# Patient Record
Sex: Female | Born: 1975
Health system: Southern US, Community
[De-identification: ages and names within clinical notes are randomized; demographics above are authoritative.]

## PROBLEM LIST (undated history)

## (undated) DIAGNOSIS — Z8619 Personal history of other infectious and parasitic diseases: Secondary | ICD-10-CM

## (undated) DIAGNOSIS — IMO0002 Reserved for concepts with insufficient information to code with codable children: Secondary | ICD-10-CM

## (undated) DIAGNOSIS — O24419 Gestational diabetes mellitus in pregnancy, unspecified control: Secondary | ICD-10-CM

## (undated) DIAGNOSIS — O09529 Supervision of elderly multigravida, unspecified trimester: Secondary | ICD-10-CM

## (undated) DIAGNOSIS — J302 Other seasonal allergic rhinitis: Secondary | ICD-10-CM

## (undated) DIAGNOSIS — F419 Anxiety disorder, unspecified: Secondary | ICD-10-CM

## (undated) DIAGNOSIS — R87619 Unspecified abnormal cytological findings in specimens from cervix uteri: Secondary | ICD-10-CM

## (undated) HISTORY — PX: BACK SURGERY: SHX140

## (undated) HISTORY — DX: Gestational diabetes mellitus in pregnancy, unspecified control: O24.419

## (undated) HISTORY — DX: Supervision of elderly multigravida, unspecified trimester: O09.529

## (undated) HISTORY — DX: Other seasonal allergic rhinitis: J30.2

## (undated) HISTORY — PX: AUGMENTATION MAMMAPLASTY: SUR837

## (undated) HISTORY — DX: Anxiety disorder, unspecified: F41.9

## (undated) HISTORY — PX: BREAST ENHANCEMENT SURGERY: SHX7

## (undated) HISTORY — DX: Personal history of other infectious and parasitic diseases: Z86.19

## (undated) HISTORY — PX: LEEP: SHX91

## (undated) HISTORY — DX: Reserved for concepts with insufficient information to code with codable children: IMO0002

## (undated) HISTORY — DX: Unspecified abnormal cytological findings in specimens from cervix uteri: R87.619

---

## 2006-04-23 ENCOUNTER — Emergency Department (HOSPITAL_COMMUNITY): Admission: EM | Admit: 2006-04-23 | Discharge: 2006-04-23 | Payer: Self-pay | Admitting: Family Medicine

## 2008-07-22 ENCOUNTER — Other Ambulatory Visit: Admission: RE | Admit: 2008-07-22 | Discharge: 2008-07-22 | Payer: Self-pay | Admitting: Family Medicine

## 2011-03-10 ENCOUNTER — Inpatient Hospital Stay (INDEPENDENT_AMBULATORY_CARE_PROVIDER_SITE_OTHER)
Admission: RE | Admit: 2011-03-10 | Discharge: 2011-03-10 | Disposition: A | Discharge status: Home / Self Care | Payer: 59 | Source: Ambulatory Visit | Attending: Emergency Medicine | Admitting: Emergency Medicine

## 2011-03-10 DIAGNOSIS — B86 Scabies: Secondary | ICD-10-CM

## 2011-04-12 ENCOUNTER — Inpatient Hospital Stay (INDEPENDENT_AMBULATORY_CARE_PROVIDER_SITE_OTHER)
Admission: RE | Admit: 2011-04-12 | Discharge: 2011-04-12 | Disposition: A | Discharge status: Home / Self Care | Payer: 59 | Source: Ambulatory Visit | Attending: Emergency Medicine | Admitting: Emergency Medicine

## 2011-04-12 DIAGNOSIS — L988 Other specified disorders of the skin and subcutaneous tissue: Secondary | ICD-10-CM

## 2011-04-12 DIAGNOSIS — L299 Pruritus, unspecified: Secondary | ICD-10-CM

## 2011-10-24 ENCOUNTER — Ambulatory Visit: Payer: 59 | Admitting: Family Medicine

## 2012-07-04 LAB — OB RESULTS CONSOLE ABO/RH: RH Type: POSITIVE

## 2012-07-04 LAB — OB RESULTS CONSOLE RPR: RPR: NONREACTIVE

## 2012-07-04 LAB — OB RESULTS CONSOLE GC/CHLAMYDIA: Chlamydia: NEGATIVE

## 2012-07-04 LAB — OB RESULTS CONSOLE GBS: GBS: POSITIVE

## 2012-07-04 LAB — OB RESULTS CONSOLE ANTIBODY SCREEN: Antibody Screen: NEGATIVE

## 2012-09-19 NOTE — L&D Delivery Note (Signed)
Delivery Note  First Stage: Labor onset: 0905 - labor induction with cytotec 25 mcg and cervical balloon Augmentation : pitocin low dose after epidural Analgesia /Anesthesia intrapartum: epidural AROM at 1305  Second Stage: Complete dilation at 1750 Onset of pushing at 1800 with 45 minutes rest 1900-1945 (provider attending another birth) FHR second stage cat 1  Delivery of a viable female at 2020 by CNM in ROA position No nuchal cord Cord double clamped after cessation of pulsation, cut by FOB Cord blood sample collected    Third Stage: Placenta delivered [facility] intact with 3 VC @ 2028 Placenta disposition: for disposal Uterine tone firm / bleeding initially brisk - uterine massage until firm  / bladder emptied  Small irregular right labial laceration identified  Anesthesia for repair: epidural Repair 4-0 2 sutures to re-approximate tissue Est. Blood Loss (mL): 400  Complications: none  Mom to postpartum.  Baby to nursery-stable.  Marlinda Mike CNM, MSN 01/22/2013, 9:10 PM

## 2012-10-24 ENCOUNTER — Ambulatory Visit (HOSPITAL_COMMUNITY)
Admission: RE | Admit: 2012-10-24 | Discharge: 2012-10-24 | Disposition: A | Discharge status: Home / Self Care | Payer: 59 | Source: Ambulatory Visit | Attending: Obstetrics & Gynecology | Admitting: Obstetrics & Gynecology

## 2012-10-24 ENCOUNTER — Other Ambulatory Visit (HOSPITAL_COMMUNITY): Payer: Self-pay | Admitting: Obstetrics & Gynecology

## 2012-10-24 DIAGNOSIS — R609 Edema, unspecified: Secondary | ICD-10-CM

## 2012-10-24 DIAGNOSIS — R52 Pain, unspecified: Secondary | ICD-10-CM

## 2012-10-24 DIAGNOSIS — M7989 Other specified soft tissue disorders: Secondary | ICD-10-CM

## 2012-10-24 DIAGNOSIS — M79609 Pain in unspecified limb: Secondary | ICD-10-CM | POA: Insufficient documentation

## 2012-10-24 DIAGNOSIS — O99891 Other specified diseases and conditions complicating pregnancy: Secondary | ICD-10-CM | POA: Insufficient documentation

## 2012-10-24 NOTE — Progress Notes (Signed)
VASCULAR LAB PRELIMINARY  PRELIMINARY  PRELIMINARY  PRELIMINARY  Left lower extremity venous duplex completed.    Preliminary report:  Left:  No evidence of DVT, superficial thrombosis, or Baker's cyst.  Devean Skoczylas, RVT 10/24/2012, 10:26 AM

## 2012-11-24 ENCOUNTER — Encounter: Payer: 59 | Attending: Certified Nurse Midwife | Admitting: *Deleted

## 2012-11-24 ENCOUNTER — Encounter: Payer: Self-pay | Admitting: *Deleted

## 2012-11-24 DIAGNOSIS — Z713 Dietary counseling and surveillance: Secondary | ICD-10-CM | POA: Insufficient documentation

## 2012-11-24 DIAGNOSIS — O99810 Abnormal glucose complicating pregnancy: Secondary | ICD-10-CM | POA: Insufficient documentation

## 2012-11-24 NOTE — Progress Notes (Signed)
  Patient was seen on 11/24/2012 for Gestational Diabetes self-management class at the [facility]. The following learning objectives were met by the patient during this course:   States the definition of Gestational Diabetes  States why dietary management is important in controlling blood glucose  Describes the effects each nutrient has on blood glucose levels  Demonstrates ability to create a balanced meal plan  Demonstrates carbohydrate counting   States when to check blood glucose levels  Demonstrates proper blood glucose monitoring techniques  States the effect of stress and exercise on blood glucose levels  States the importance of limiting caffeine and abstaining from alcohol and smoking  Blood glucose monitor not given: she has already purchased and states she is currently checking her BG  Patient instructed to monitor glucose levels: FBS: 60 - <90 2 hour: <120  *Patient received handouts:  Nutrition Diabetes and Pregnancy  Carbohydrate Counting List  Patient will be seen for follow-up as needed.

## 2012-11-24 NOTE — Patient Instructions (Signed)
Goals:  Check glucose levels per MD as instructed  Follow Gestational Diabetes Diet as instructed  Call for follow-up as needed    

## 2013-01-21 ENCOUNTER — Telehealth (HOSPITAL_COMMUNITY): Payer: Self-pay | Admitting: *Deleted

## 2013-01-21 ENCOUNTER — Encounter (HOSPITAL_COMMUNITY): Payer: Self-pay | Admitting: *Deleted

## 2013-01-21 NOTE — Telephone Encounter (Signed)
Preadmission screen  

## 2013-01-22 ENCOUNTER — Encounter (HOSPITAL_COMMUNITY): Payer: Self-pay

## 2013-01-22 ENCOUNTER — Encounter (HOSPITAL_COMMUNITY): Payer: Self-pay | Admitting: Anesthesiology

## 2013-01-22 ENCOUNTER — Inpatient Hospital Stay (HOSPITAL_COMMUNITY): Payer: 59 | Admitting: Anesthesiology

## 2013-01-22 ENCOUNTER — Inpatient Hospital Stay (HOSPITAL_COMMUNITY)
Admission: RE | Admit: 2013-01-22 | Discharge: 2013-01-24 | DRG: 775 | Disposition: A | Discharge status: Home / Self Care | Payer: 59 | Source: Ambulatory Visit | Attending: Obstetrics and Gynecology | Admitting: Obstetrics and Gynecology

## 2013-01-22 DIAGNOSIS — D62 Acute posthemorrhagic anemia: Secondary | ICD-10-CM | POA: Diagnosis not present

## 2013-01-22 DIAGNOSIS — O9903 Anemia complicating the puerperium: Secondary | ICD-10-CM | POA: Diagnosis not present

## 2013-01-22 DIAGNOSIS — G5600 Carpal tunnel syndrome, unspecified upper limb: Secondary | ICD-10-CM | POA: Diagnosis present

## 2013-01-22 DIAGNOSIS — O09529 Supervision of elderly multigravida, unspecified trimester: Secondary | ICD-10-CM | POA: Diagnosis present

## 2013-01-22 DIAGNOSIS — Z22330 Carrier of Group B streptococcus: Secondary | ICD-10-CM

## 2013-01-22 DIAGNOSIS — O99814 Abnormal glucose complicating childbirth: Principal | ICD-10-CM | POA: Diagnosis present

## 2013-01-22 DIAGNOSIS — O99892 Other specified diseases and conditions complicating childbirth: Secondary | ICD-10-CM | POA: Diagnosis present

## 2013-01-22 DIAGNOSIS — O24410 Gestational diabetes mellitus in pregnancy, diet controlled: Secondary | ICD-10-CM | POA: Diagnosis present

## 2013-01-22 LAB — CBC
HCT: 33.2 % — ABNORMAL LOW (ref 36.0–46.0)
Hemoglobin: 10.8 g/dL — ABNORMAL LOW (ref 12.0–15.0)
MCH: 29.3 pg (ref 26.0–34.0)
MCHC: 32.5 g/dL (ref 30.0–36.0)
MCV: 90.2 fL (ref 78.0–100.0)
Platelets: 180 10*3/uL (ref 150–400)
RBC: 3.68 MIL/uL — ABNORMAL LOW (ref 3.87–5.11)
RDW: 14 % (ref 11.5–15.5)
WBC: 9.4 10*3/uL (ref 4.0–10.5)

## 2013-01-22 LAB — GLUCOSE, RANDOM: Glucose, Bld: 100 mg/dL — ABNORMAL HIGH (ref 70–99)

## 2013-01-22 LAB — RPR: RPR Ser Ql: NONREACTIVE

## 2013-01-22 MED ORDER — SODIUM CHLORIDE 0.9 % IV SOLN: 250.0000 mL | INTRAVENOUS | Status: DC | PRN

## 2013-01-22 MED ORDER — OXYTOCIN BOLUS FROM INFUSION
500.0000 mL | INTRAVENOUS | Status: DC
  Administered 2013-01-22: 500 mL via INTRAVENOUS

## 2013-01-22 MED ORDER — MISOPROSTOL 25 MCG QUARTER TABLET
25.0000 ug | ORAL_TABLET | Freq: Once | ORAL | Status: AC
  Administered 2013-01-22: 25 ug via VAGINAL
  Filled 2013-01-22: qty 0.25

## 2013-01-22 MED ORDER — LACTATED RINGERS IV SOLN
500.0000 mL | INTRAVENOUS | Status: DC | PRN
  Administered 2013-01-22: 500 mL via INTRAVENOUS

## 2013-01-22 MED ORDER — CITRIC ACID-SODIUM CITRATE 334-500 MG/5ML PO SOLN: 30.0000 mL | ORAL | Status: DC | PRN

## 2013-01-22 MED ORDER — FENTANYL 2.5 MCG/ML BUPIVACAINE 1/10 % EPIDURAL INFUSION (WH - ANES)
INTRAMUSCULAR | Status: DC | PRN
  Administered 2013-01-22: 14 mL/h via EPIDURAL

## 2013-01-22 MED ORDER — EPHEDRINE 5 MG/ML INJ
10.0000 mg | INTRAVENOUS | Status: DC | PRN
  Filled 2013-01-22: qty 2

## 2013-01-22 MED ORDER — LIDOCAINE HCL (PF) 1 % IJ SOLN
30.0000 mL | INTRAMUSCULAR | Status: DC | PRN
  Filled 2013-01-22: qty 30

## 2013-01-22 MED ORDER — OXYTOCIN 40 UNITS IN LACTATED RINGERS INFUSION - SIMPLE MED
1.0000 m[IU]/min | INTRAVENOUS | Status: DC
  Administered 2013-01-22: 8 m[IU]/min via INTRAVENOUS
  Administered 2013-01-22: 6 m[IU]/min via INTRAVENOUS
  Administered 2013-01-22: 4 m[IU]/min via INTRAVENOUS
  Administered 2013-01-22: 10 m[IU]/min via INTRAVENOUS
  Administered 2013-01-22: 2 m[IU]/min via INTRAVENOUS
  Filled 2013-01-22: qty 1000

## 2013-01-22 MED ORDER — SODIUM CHLORIDE 0.9 % IJ SOLN: 3.0000 mL | INTRAMUSCULAR | Status: DC | PRN

## 2013-01-22 MED ORDER — ACETAMINOPHEN 325 MG PO TABS: 650.0000 mg | ORAL_TABLET | ORAL | Status: DC | PRN

## 2013-01-22 MED ORDER — FENTANYL 2.5 MCG/ML BUPIVACAINE 1/10 % EPIDURAL INFUSION (WH - ANES)
14.0000 mL/h | INTRAMUSCULAR | Status: DC | PRN
  Filled 2013-01-22: qty 125

## 2013-01-22 MED ORDER — LACTATED RINGERS IV SOLN: 500.0000 mL | Freq: Once | INTRAVENOUS | Status: DC

## 2013-01-22 MED ORDER — EPHEDRINE 5 MG/ML INJ
10.0000 mg | INTRAVENOUS | Status: DC | PRN
  Filled 2013-01-22: qty 4
  Filled 2013-01-22: qty 2
  Filled 2013-01-22: qty 4

## 2013-01-22 MED ORDER — OXYTOCIN 40 UNITS IN LACTATED RINGERS INFUSION - SIMPLE MED: 62.5000 mL/h | INTRAVENOUS | Status: DC

## 2013-01-22 MED ORDER — PHENYLEPHRINE 40 MCG/ML (10ML) SYRINGE FOR IV PUSH (FOR BLOOD PRESSURE SUPPORT)
80.0000 ug | PREFILLED_SYRINGE | INTRAVENOUS | Status: DC | PRN
  Filled 2013-01-22: qty 2

## 2013-01-22 MED ORDER — SODIUM CHLORIDE 0.9 % IJ SOLN: 3.0000 mL | Freq: Two times a day (BID) | INTRAMUSCULAR | Status: DC

## 2013-01-22 MED ORDER — VANCOMYCIN HCL IN DEXTROSE 1-5 GM/200ML-% IV SOLN
1000.0000 mg | Freq: Two times a day (BID) | INTRAVENOUS | Status: DC
  Administered 2013-01-22: 1000 mg via INTRAVENOUS
  Filled 2013-01-22 (×3): qty 200

## 2013-01-22 MED ORDER — IBUPROFEN 600 MG PO TABS
600.0000 mg | ORAL_TABLET | Freq: Four times a day (QID) | ORAL | Status: DC | PRN
  Administered 2013-01-22: 600 mg via ORAL
  Filled 2013-01-22: qty 1

## 2013-01-22 MED ORDER — LIDOCAINE HCL (PF) 1 % IJ SOLN
INTRAMUSCULAR | Status: DC | PRN
  Administered 2013-01-22 (×2): 8 mL

## 2013-01-22 MED ORDER — DIPHENHYDRAMINE HCL 50 MG/ML IJ SOLN: 12.5000 mg | INTRAMUSCULAR | Status: DC | PRN

## 2013-01-22 MED ORDER — PHENYLEPHRINE 40 MCG/ML (10ML) SYRINGE FOR IV PUSH (FOR BLOOD PRESSURE SUPPORT)
80.0000 ug | PREFILLED_SYRINGE | INTRAVENOUS | Status: DC | PRN
  Filled 2013-01-22: qty 5
  Filled 2013-01-22: qty 2

## 2013-01-22 NOTE — Progress Notes (Signed)
PROCEDURE NOTE  silicone cervical balloon placed bimanually without difficulty inflated with 60 ml patient tolerated procedure well - no pain or bleeding Secured to leg with mild traction  Cytotec 25 mcg placed posterior fornix  PLAN: apply traction to balloon  re-evaluate after balloon out   Marlinda Mike CNM

## 2013-01-22 NOTE — H&P (Signed)
  OB ADMISSION/ HISTORY & PHYSICAL:  Admission Date: 01/22/2013  7:17 AM  Admit Diagnosis: induction of labor for GDMa1  Vanessa [name] is a 37 y.o. female presenting for labor induction.  Prenatal History: G1P0000   EDC : 01/29/2013, Alternate EDD Entry  Prenatal care at [facility]  Primary Ob Provider: Kathi Ludwig Prenatal course complicated by [facility]  / GDMa1 / resistant GBS bacturia  Prenatal Labs: ABO, Rh: A (10/08 0000) positive Antibody: Negative (10/16 0000) Rubella: Immune (10/16 0000)  RPR: Nonreactive (10/16 0000)  HBsAg: Negative (10/16 0000)  HIV: Non-reactive (10/16 0000)  GBS: Positive (10/16 0000)  1 hr Glucola : ABNORMAL  Medical / Surgical History :  Past medical history:  Past Medical History  Diagnosis Date  . Diabetes mellitus without complication   . Gestational diabetes   . Abnormal Pap smear   . AMA (advanced maternal age) multigravida 35+   . Hx of varicella     Past surgical history:  Past Surgical History  Procedure Laterality Date  . Leep    . Breast enhancement surgery     Family History:  Family History  Problem Relation Age of Onset  . Rheum arthritis Mother   . Anemia Mother   . Migraines Mother   . Diabetes Father   . Hypertension Father   . Diabetes Maternal Grandmother   . Stroke Maternal Grandfather   . Heart disease Maternal Grandfather   . COPD Paternal Grandfather      Social History:  reports that she quit smoking about 16 years ago. She has never used smokeless tobacco. She reports that she does not drink alcohol or use illicit drugs.  Allergies: Penicillins and Sulfa antibiotics   Current Medications at time of admission:  Prenatal daily  Review of Systems: irregular ctx no LOF active FM  Physical Exam:  VS: Blood pressure 117/73, pulse 77, temperature 98.6 F (37 C), temperature source Oral, resp. rate 18, height 5\' 6"  (1.676 m), weight 73.936 kg (163 lb).  General: alert and oriented, appears  comfortable  Heart: RRR Lungs: Clear lung fields Abdomen: Gravid, soft and non-tender, non-distended / uterus: gravid and non-tender Extremities: trace edema - wearing compression socks  Genitalia / VE: 2-3 / 70% / -1 FHR: category 1 TOCO: rare ctx  Assessment: [redacted] weeks gestation GDMA1 FHR category 1  Plan:  cytotec and cervical balloon - then pitocin / AROM Dr [place] notified of admission / plan of care   Marlinda Mike CNM, MSN 01/22/2013, 0800

## 2013-01-22 NOTE — Progress Notes (Signed)
S:  Comfortable with epidural  O:  VS: Blood pressure 112/75, pulse 95, temperature 98.1 F (36.7 C), temperature source Oral, resp. rate 20, height 5\' 6"  (1.676 m), weight 73.936 kg (163 lb), SpO2 100.00%.        FHR : baseline 145 / variability moderate / accels + / decels none        Toco: contractions every 2-3 minutes / strong  / MVU adequate        Cervix : 10/100%/ Vtx +1        Membranes: clear fluid / + show  A: active labor     FHR category 1  P: start active second stage     Marlinda Mike CNM, MSN 01/22/2013, 1800pm

## 2013-01-22 NOTE — Progress Notes (Signed)
S:  Balloon out  O:  VS: Blood pressure 112/69, pulse 72, temperature 98.1 F (36.7 C), temperature source Oral, resp. rate 18, height 5\' 6"  (1.676 m), weight 73.936 kg (163 lb).        FHR : category 1        Toco: contractions every 1-3 minutes / 40-50 sec / mild       Cervix : 6/100% / vtx +1 BBOW / + show        Membranes: AROM - clear  A: latent labor - successful labor induction     FHR category 1  P: IUPC to assess MVU - start pitocin     Marlinda Mike CNM, MSN 01/22/2013, 1:01 PM

## 2013-01-22 NOTE — Progress Notes (Signed)
S:  Comfortable with epidural - no pain or pressure  O:  VS: Blood pressure 128/71, pulse 85, temperature 98.6 F (37 C), temperature source Oral, resp. rate 18, height 5\' 6"  (1.676 m), weight 73.936 kg (163 lb), SpO2 100.00%.        FHR : baseline 130 / variability moderate / accels + / decels none        Toco: contractions every 2-3 minutes / mild / MVU 70-110 ( No pitocin started yet)        Cervix : 6 / 90% / vtx / 0        Membranes: clear fluid  A: latent labor     FHR category 1  P: pitocin augmentation      Foley      Reposition lateral   Marlinda Mike CNM, MSN 01/22/2013, 3:21 PM

## 2013-01-22 NOTE — Anesthesia Procedure Notes (Signed)
Epidural Patient location during procedure: OB Start time: 01/22/2013 2:17 PM End time: 01/22/2013 2:25 PM  Staffing Anesthesiologist: Sandrea Hughs Performed by: anesthesiologist   Preanesthetic Checklist Completed: patient identified, site marked, surgical consent, pre-op evaluation, timeout performed, IV checked, risks and benefits discussed and monitors and equipment checked  Epidural Patient position: sitting Prep: site prepped and draped and DuraPrep Patient monitoring: continuous pulse ox and blood pressure Approach: midline Injection technique: LOR air  Needle:  Needle type: Tuohy  Needle gauge: 17 G Needle length: 9 cm and 9 Needle insertion depth: 5 cm cm Catheter type: closed end flexible Catheter size: 19 Gauge Catheter at skin depth: 10 cm Test dose: negative and Other  Assessment Sensory level: T9 Events: blood not aspirated, injection not painful, no injection resistance, negative IV test and no paresthesia  Additional Notes Reason for block:procedure for pain

## 2013-01-22 NOTE — Anesthesia Preprocedure Evaluation (Signed)
Anesthesia Evaluation  Patient identified by MRN, date of birth, ID band Patient awake    Reviewed: Allergy & Precautions, H&P , NPO status , Patient's Chart, lab work & pertinent test results  Airway Mallampati: I TM Distance: >3 FB Neck ROM: full    Dental no notable dental hx.    Pulmonary neg pulmonary ROS,    Pulmonary exam normal       Cardiovascular negative cardio ROS      Neuro/Psych negative neurological ROS  negative psych ROS   GI/Hepatic negative GI ROS, Neg liver ROS,   Endo/Other    Renal/GU negative Renal ROS  negative genitourinary   Musculoskeletal negative musculoskeletal ROS (+)   Abdominal Normal abdominal exam  (+)   Peds negative pediatric ROS (+)  Hematology negative hematology ROS (+)   Anesthesia Other Findings   Reproductive/Obstetrics (+) Pregnancy                           Anesthesia Physical Anesthesia Plan  ASA: II  Anesthesia Plan: Epidural   Post-op Pain Management:    Induction:   Airway Management Planned:   Additional Equipment:   Intra-op Plan:   Post-operative Plan:   Informed Consent: I have reviewed the patients History and Physical, chart, labs and discussed the procedure including the risks, benefits and alternatives for the proposed anesthesia with the patient or authorized representative who has indicated his/her understanding and acceptance.     Plan Discussed with:   Anesthesia Plan Comments:         Anesthesia Quick Evaluation

## 2013-01-23 LAB — CBC
HCT: 28.2 % — ABNORMAL LOW (ref 36.0–46.0)
Hemoglobin: 9.4 g/dL — ABNORMAL LOW (ref 12.0–15.0)
MCH: 29.7 pg (ref 26.0–34.0)
MCHC: 33.3 g/dL (ref 30.0–36.0)
MCV: 89.2 fL (ref 78.0–100.0)
Platelets: 172 10*3/uL (ref 150–400)
RBC: 3.16 MIL/uL — ABNORMAL LOW (ref 3.87–5.11)
RDW: 13.9 % (ref 11.5–15.5)
WBC: 15.9 10*3/uL — ABNORMAL HIGH (ref 4.0–10.5)

## 2013-01-23 MED ORDER — HYDROCORTISONE 2.5 % RE CREA
TOPICAL_CREAM | Freq: Three times a day (TID) | RECTAL | Status: DC
  Administered 2013-01-23 – 2013-01-24 (×3): via RECTAL
  Filled 2013-01-23: qty 28.35

## 2013-01-23 MED ORDER — OXYCODONE-ACETAMINOPHEN 5-325 MG PO TABS
1.0000 | ORAL_TABLET | ORAL | Status: DC | PRN
  Administered 2013-01-23 (×3): 1 via ORAL
  Administered 2013-01-24 (×2): 2 via ORAL
  Filled 2013-01-23 (×3): qty 1
  Filled 2013-01-23 (×2): qty 2

## 2013-01-23 MED ORDER — SENNOSIDES-DOCUSATE SODIUM 8.6-50 MG PO TABS
2.0000 | ORAL_TABLET | Freq: Every day | ORAL | Status: DC
  Administered 2013-01-23: 2 via ORAL

## 2013-01-23 MED ORDER — IBUPROFEN 600 MG PO TABS
600.0000 mg | ORAL_TABLET | Freq: Four times a day (QID) | ORAL | Status: DC
  Administered 2013-01-23 – 2013-01-24 (×6): 600 mg via ORAL
  Filled 2013-01-23 (×6): qty 1

## 2013-01-23 MED ORDER — DIPHENHYDRAMINE HCL 25 MG PO CAPS: 25.0000 mg | ORAL_CAPSULE | Freq: Four times a day (QID) | ORAL | Status: DC | PRN

## 2013-01-23 MED ORDER — BENZOCAINE-MENTHOL 20-0.5 % EX AERO
1.0000 "application " | INHALATION_SPRAY | CUTANEOUS | Status: DC | PRN
  Filled 2013-01-23: qty 56

## 2013-01-23 MED ORDER — SIMETHICONE 80 MG PO CHEW: 80.0000 mg | CHEWABLE_TABLET | ORAL | Status: DC | PRN

## 2013-01-23 MED ORDER — WITCH HAZEL-GLYCERIN EX PADS
1.0000 "application " | MEDICATED_PAD | CUTANEOUS | Status: DC | PRN
  Administered 2013-01-23: 1 via TOPICAL

## 2013-01-23 MED ORDER — DIBUCAINE 1 % RE OINT: 1.0000 "application " | TOPICAL_OINTMENT | RECTAL | Status: DC | PRN

## 2013-01-23 MED ORDER — LANOLIN HYDROUS EX OINT: TOPICAL_OINTMENT | CUTANEOUS | Status: DC | PRN

## 2013-01-23 NOTE — Lactation Note (Signed)
This note was copied from the chart of Vanessa [name]. Lactation Consultation Note  Patient Name: Vanessa [name] Date: 01/23/2013 Reason for consult: Initial assessment   Maternal Data Formula Feeding for Exclusion: No Infant to breast within first hour of birth: Yes Does the patient have breastfeeding experience prior to this delivery?: No  Feeding Feeding Type: Breast Milk Feeding method: Breast Length of feed: 25 min  LATCH Score/Interventions Latch: Grasps breast easily, tongue down, lips flanged, rhythmical sucking.  Audible Swallowing: A few with stimulation  Type of Nipple: Everted at rest and after stimulation  Comfort (Breast/Nipple): Soft / non-tender     Hold (Positioning): Assistance needed to correctly position infant at breast and maintain latch. Intervention(s): Breastfeeding basics reviewed;Support Pillows;Position options  LATCH Score: 8  Lactation Tools Discussed/Used     Consult Status Consult Status: Follow-up Date: 01/24/13 Follow-up type: In-patient  Initial visit with mom. She reports that baby nursed well after delivery but not as well through the night. Reports nipples are slightly tender today. Assisted with latch- mom reports that feels much better. Reviewed wide open mouth and keeping the baby deep onto the breast with good support of the breast throughout the feeding. Reviewed feeding cues and encouraged to feed whenever she sees them. BF brochure given with resources for support after DC. Encouraged to page for assist prn. No questions at present.   Pamelia Hoit 01/23/2013, 10:44 AM

## 2013-01-23 NOTE — Progress Notes (Signed)
Patient ID: Vanessa [name], female   DOB: [birth year], 37 y.o.   MRN: [id number] PPD # 1 S/P SVD  Subjective: Pt reports feeling well, sore, tired/ Pain controlled with ibuprofen Tolerating po/ Voiding without problems/ No n/v Bleeding is light Newborn info:  F Feeding: breast   Objective:  VS: Blood pressure 105/68, pulse 77, temperature 97.6 F (36.4 C), temperature source Oral, resp. rate 18.    Recent Labs  01/22/13 0830 01/23/13 0700  WBC 9.4 15.9*  HGB 10.8* 9.4*  HCT 33.2* 28.2*  PLT 180 172    Blood type: --/Positive/-- (10/16 0000) Rubella: Immune (10/16 0000)    Physical Exam:  General: A & O x 3  alert, cooperative and no distress CV: Regular rate and rhythm Resp: clear Abdomen: soft, nontender, normal bowel sounds Uterine Fundus: firm, below umbilicus, nontender Perineum: Intact with mod edema; mild ecchymosis noted Lochia: moderate Ext: Homans sign is negative, no sign of DVT and no edema, redness or tenderness in the calves or thighs   A/P: PPD # 1/ G2P1001/ S/P: SVD Mild ABL anemia; fe supplement at d/c to home Doing well Continue routine post partum orders Anticipate D/C home tomorrow    Demetrius Revel, MSN, [facility] 01/23/2013, 9:38 AM

## 2013-01-23 NOTE — Anesthesia Postprocedure Evaluation (Signed)
  Anesthesia Post-op Note  Patient: Vanessa [name]  Procedure(s) Performed: * No procedures listed *  Patient Location: PACU and Mother/Baby  Anesthesia Type:Epidural  Level of Consciousness: awake, alert  and oriented  Airway and Oxygen Therapy: Patient Spontanous Breathing  Post-op Pain: mild  Post-op Assessment: Patient's Cardiovascular Status Stable, Respiratory Function Stable, No signs of Nausea or vomiting, Adequate PO intake, Pain level controlled, No headache, No backache, No residual numbness and No residual motor weakness  Post-op Vital Signs: stable  Complications: No apparent anesthesia complications

## 2013-01-24 ENCOUNTER — Encounter (HOSPITAL_COMMUNITY): Payer: Self-pay

## 2013-01-24 MED ORDER — FERRALET 90 90-1 MG PO TABS: 1.0000 | ORAL_TABLET | Freq: Every day | ORAL | Status: DC

## 2013-01-24 MED ORDER — OXYCODONE-ACETAMINOPHEN 5-325 MG PO TABS: 1.0000 | ORAL_TABLET | Freq: Four times a day (QID) | ORAL | Status: DC | PRN

## 2013-01-24 MED ORDER — HYDROCHLOROTHIAZIDE 12.5 MG PO CAPS: 12.5000 mg | ORAL_CAPSULE | Freq: Every day | ORAL | Status: DC

## 2013-01-24 MED ORDER — IBUPROFEN 600 MG PO TABS: 600.0000 mg | ORAL_TABLET | Freq: Four times a day (QID) | ORAL | Status: DC | PRN

## 2013-01-24 NOTE — Discharge Summary (Signed)
Obstetric Discharge Summary  Reason for Admission: G1 P0 IOL @39wks  d/t GDM A1 Prenatal Procedures: ultrasound Intrapartum Procedures: spontaneous vaginal delivery Postpartum Procedures: none Complications-Operative and Postpartum: none Hemoglobin  Date Value Range Status  01/23/2013 9.4* 12.0 - 15.0 g/dL Final     HCT  Date Value Range Status  01/23/2013 28.2* 36.0 - 46.0 % Final    Physical Exam:  General: alert, cooperative and no distress Lochia: appropriate Uterine Fundus: firm Incision: na DVT Evaluation: No evidence of DVT seen on physical exam. Negative Homan's sign.  Discharge Diagnoses: G1 P1 s/p SVD GDM A1; resolved ABL Anemia; stable Carpal Tunnel syndrome; will manage with pain meds  Discharge Information: Date: 01/24/2013 Activity: pelvic rest Diet: routine Medications: PNV, Ibuprofen, Colace and Percocet Condition: stable Instructions: refer to practice specific booklet Discharge to: home Follow-up Information   Follow up with Marlinda Mike, CNM In 6 weeks.   Contact information:   Vanessa [name] [place] [address] [phone]       Newborn Data: Live born female on 01/[birth year]/13 Birth Weight: 8 lb 4 oz (3742 g) APGAR: 9, 9  Home with mother.  Vanessa [name] 01/24/2013, 9:30 AM

## 2013-01-24 NOTE — Progress Notes (Signed)
Patient ID: Vanessa Vanessa [name] Vanessa [name], female   DOB: 03/14/1976, 37 y.o.   MRN: [id number] PPD # 2  Subjective: Pt reports feeling well and eager for d/c home/ Pain controlled with ibuprofen.  Has taken percocet for carpal tunnel pain and note relief Tolerating po/ Voiding without problems/ No n/v Bleeding is light/ Newborn info:  Information for the patient's newborn:  Vanessa Vanessa [name], Vanessa [name] [[id number]]  female Feeding: breast    Objective:  VS: Blood pressure 94/57, pulse 76, temperature 97.8 F (36.6 C), temperature source Oral, resp. rate 18.    Recent Labs  01/22/13 0830 01/23/13 0700  WBC 9.4 15.9*  HGB 10.8* 9.4*  HCT 33.2* 28.2*  PLT 180 172    Blood type:  A Positive/-- (10/16 0000) Rubella: Immune (10/16 0000)    Physical Exam:  General:  alert, cooperative and no distress CV: Regular rate and rhythm Resp: clear Abdomen: soft, nontender, normal bowel sounds Uterine Fundus: firm, below umbilicus, nontender Perineum: Intact with mild edema Lochia: minimal Ext: edema trace and Homans sign is negative, no sign of DVT    A/P: PPD # 2/ G1 P1001/ S/P: SVD ABL Anemia Carpal Tunnel pain; urged increased po fluids, ok for splints, but reassured pain should resolve in 6 to 7 days Will rx HCTZ for fluid retention to help relieve carpal tunnel pain Doing well and stable for discharge home RX: Ibuprofen 600mg  po Q 6 hrs prn pain #30 Refill x 1 Percocet 5/325 1 to 2 po Q 4 hrs prn pain #15 No refill Ferralet 90 po QD #30 Ref x 1 HCTZ 12.5 po QD x 5 WOB/GYN booklet given Routine pp visit in 6wks   Demetrius Revel, MSN, [facility] 01/24/2013, 8:49 AM

## 2013-01-28 ENCOUNTER — Ambulatory Visit (HOSPITAL_COMMUNITY)
Admission: RE | Admit: 2013-01-28 | Discharge: 2013-01-28 | Disposition: A | Discharge status: Home / Self Care | Payer: 59 | Source: Ambulatory Visit | Attending: Obstetrics and Gynecology | Admitting: Obstetrics and Gynecology

## 2013-01-28 ENCOUNTER — Telehealth (HOSPITAL_COMMUNITY): Payer: Self-pay | Admitting: Lactation Services

## 2013-01-28 NOTE — Lactation Note (Signed)
Adult Lactation Consultation Outpatient Visit Note  Patient Name: Vanessa [name]                 Baby Girl Zanna, DOB [birth year], now 38 days old, Birth Weight 8 lb. 4 oz.                                           Date of Birth: Aug 16, 1976 Gestational Age at Delivery: [redacted]w[redacted]d Type of Delivery: SVB  Breastfeeding History: Frequency of Breastfeeding: every 2-3 hours  Length of Feeding: 15-30 minutes on right breast, left breast sore, cracked/scabbed Voids: 5-6 per day Stools: 2-3/day mustard color  Supplementing / Method: Pumping:  Type of Pump:  Was using a Harmony Hand pump, Purchased a DEBP today - Medela   Frequency: Pumped 1 time with hand pump on Saturday, and 1 time today  Volume:  1/2 oz on Saturday, 1 1/2 oz today  Comments: Mom is here for feeding assessment. She reports her left nipple is cracked/scabbed and she has had to pump this breast twice as it was too sore to latch. She has tenderness on the right breast but has been breastfeeding on the right consistently. Went to Peds last Saturday and was told to supplement with formula due to baby's weight loss. Mom supplemented 2 times with formula, otherwise she has been using EBM pumped from the left breast. Mom also has difficulty with positioning due to carpel tunnel in both wrists/hands which causes numbness when holding the baby at the breast. Spoke with Mom earlier today and advised to call OB for All Purpose Nipple Cream for sore nipples and she reports they are calling this in for her. Mom has history of breast implants.    Consultation Evaluation: On exam, right nipple is red with excoriation, the left nipple is scabbed. Worked with Mom on positioning that would relieve her carpel tunnel. This baby has a strong suck and as soon as Mom's gets her nipple close the baby wants to nibble on the breast. Assisted Mom to latch baby, obtaining a deep latch with the initial latch. Mom reported no pain with nursing. When the baby came off the breast,  there was not compression, the nipple was round on both sides. No bleeding observed from either breast.   Initial Feeding Assessment: Pre-feed Weight:  7 lb. 13.8 oz/3566 gm Post-feed Weight:   7 lb. 14.8 oz/3596 gm Amount Transferred:  30 ml Comments:  From right breast with nursing for 12 minutes  Additional Feeding Assessment: Pre-feed Weight:   7 lb. 14.8 oz/3596 gm Post-feed Weight:  7 lb. 15.7 oz/3620 gm Amount Transferred:  24 ml Comments:  From Left breast with nursing for 15 minutes  Additional Feeding Assessment: Pre-feed Weight:   7 lb. 15.7 oz/3620 gm Post-feed Weight:  8 lb. 0.1 oz/3630 gm Amount Transferred:  10 ml Comments:  With Mom re-latching to left breast without assist  Total Breast milk Transferred this Visit: 64 ml Total Supplement Given:   None  Additional Interventions: Mom was relieved that baby was getting a good feeding and she did not have pain with breast feeding. Reviewed ways to obtain a deep latch with initial latch. Reviewed positioning. Care for sore nipples reviewed. Comfort gels given with instructions to alternate with All Purpose Nipple Cream. Advised to limit pumping as we do not want to encourage over production with implants. If the  baby does not BF, she needs to pump, but if the baby is breast feeding 8-12 times in 24 hours she should not need to pump based on this feeding today.   Follow-Up Prn Support group.     Alfred Levins 01/28/2013, 4:18 PM

## 2013-01-29 ENCOUNTER — Inpatient Hospital Stay (HOSPITAL_COMMUNITY): Admission: AD | Admit: 2013-01-29 | Payer: Self-pay | Source: Ambulatory Visit | Admitting: Obstetrics & Gynecology

## 2013-02-01 ENCOUNTER — Ambulatory Visit (HOSPITAL_COMMUNITY): Payer: 59

## 2014-05-28 ENCOUNTER — Ambulatory Visit: Payer: 59 | Admitting: Internal Medicine

## 2014-07-21 ENCOUNTER — Encounter (HOSPITAL_COMMUNITY): Payer: Self-pay

## 2015-03-05 ENCOUNTER — Ambulatory Visit
Admission: RE | Admit: 2015-03-05 | Discharge: 2015-03-05 | Disposition: A | Discharge status: Home / Self Care | Payer: 59 | Source: Ambulatory Visit | Attending: Otolaryngology | Admitting: Otolaryngology

## 2015-03-05 ENCOUNTER — Other Ambulatory Visit: Payer: Self-pay | Admitting: Otolaryngology

## 2015-03-05 DIAGNOSIS — R0981 Nasal congestion: Secondary | ICD-10-CM

## 2015-09-22 MED FILL — FLUoxetine HCL 10 MG CAPS: 10 | 90 days supply | Qty: 90 | Fill #0

## 2015-09-22 MED FILL — INTROVALE 0.15-0.03 MG TAB: 0.15-0.03 | 90 days supply | Qty: 91 | Fill #0

## 2015-10-28 MED FILL — NABUMETONE 500 MG TABLET: 500 | 30 days supply | Qty: 60 | Fill #1

## 2015-12-09 DIAGNOSIS — R7303 Prediabetes: Secondary | ICD-10-CM | POA: Diagnosis not present

## 2015-12-09 DIAGNOSIS — Z13220 Encounter for screening for lipoid disorders: Secondary | ICD-10-CM | POA: Diagnosis not present

## 2015-12-09 DIAGNOSIS — Z0000 Encounter for general adult medical examination without abnormal findings: Secondary | ICD-10-CM | POA: Diagnosis not present

## 2015-12-09 DIAGNOSIS — E559 Vitamin D deficiency, unspecified: Secondary | ICD-10-CM | POA: Diagnosis not present

## 2015-12-09 DIAGNOSIS — R5382 Chronic fatigue, unspecified: Secondary | ICD-10-CM | POA: Diagnosis not present

## 2015-12-10 ENCOUNTER — Other Ambulatory Visit: Payer: Self-pay | Admitting: Family Medicine

## 2015-12-10 DIAGNOSIS — K469 Unspecified abdominal hernia without obstruction or gangrene: Secondary | ICD-10-CM

## 2015-12-11 ENCOUNTER — Ambulatory Visit (HOSPITAL_COMMUNITY): Payer: 59

## 2015-12-14 MED FILL — INTROVALE 0.15-0.03 MG TAB: 0.15-0.03 | 90 days supply | Qty: 91 | Fill #1

## 2015-12-16 MED FILL — FLUoxetine HCL 10 MG CAPS: 10 | 90 days supply | Qty: 90 | Fill #1

## 2015-12-17 ENCOUNTER — Ambulatory Visit
Admission: RE | Admit: 2015-12-17 | Discharge: 2015-12-17 | Disposition: A | Discharge status: Home / Self Care | Payer: 59 | Source: Ambulatory Visit | Attending: Family Medicine | Admitting: Family Medicine

## 2015-12-17 DIAGNOSIS — K469 Unspecified abdominal hernia without obstruction or gangrene: Secondary | ICD-10-CM

## 2015-12-17 DIAGNOSIS — K439 Ventral hernia without obstruction or gangrene: Secondary | ICD-10-CM | POA: Diagnosis not present

## 2016-01-13 ENCOUNTER — Ambulatory Visit: Payer: Self-pay | Admitting: Surgery

## 2016-01-13 DIAGNOSIS — K429 Umbilical hernia without obstruction or gangrene: Secondary | ICD-10-CM | POA: Diagnosis not present

## 2016-01-19 ENCOUNTER — Telehealth: Payer: 59 | Admitting: Family

## 2016-01-19 DIAGNOSIS — R0602 Shortness of breath: Secondary | ICD-10-CM

## 2016-01-19 DIAGNOSIS — R05 Cough: Secondary | ICD-10-CM | POA: Diagnosis not present

## 2016-01-19 DIAGNOSIS — R059 Cough, unspecified: Secondary | ICD-10-CM

## 2016-01-19 NOTE — Progress Notes (Signed)
Based on what you shared with me it looks like you have a serious condition that should be evaluated in a face to face office visit.  If you are having a true medical emergency please call 911.  If you need an urgent face to face visit, [place] has four urgent care centers for your convenience.  . [facility]  [phone] Get Driving Directions Find a Provider at this Location  [address] [place], [place] [address] . 8 am to 8 pm Monday-Friday . 9 am to 7 pm Saturday-Sunday  . [facility]  [phone] Get Driving Directions Find a Provider at this Location  [address], [address] [place], [place] [address] . 8 am to 8 pm Monday-Friday . 9 am to 6 pm Saturday . 11 am to 6 pm Sunday   . [facility] [place]  [phone] Get Driving Directions  [address] [place], [place] [address] . 8 am to 8 pm Monday-Friday . 9 am to 4 pm Saturday-Sunday   . [facility] (a walk in primary care provider)  [phone]  Get Driving Directions Find a Provider at this Location  [address] [place], [place] [address] . 8 am to 8:30 pm Monday-Thursday . 8 am to 6 pm Friday . 8 am to 4 pm Saturday-Sunday   Your e-visit answers were reviewed by a board certified advanced clinical practitioner to complete your personal care plan.  Thank you for using e-Visits. 

## 2016-02-04 DIAGNOSIS — J329 Chronic sinusitis, unspecified: Secondary | ICD-10-CM | POA: Diagnosis not present

## 2016-02-04 DIAGNOSIS — J342 Deviated nasal septum: Secondary | ICD-10-CM | POA: Diagnosis not present

## 2016-02-19 ENCOUNTER — Ambulatory Visit (HOSPITAL_BASED_OUTPATIENT_CLINIC_OR_DEPARTMENT_OTHER): Admit: 2016-02-19 | Payer: 59 | Admitting: Surgery

## 2016-02-19 ENCOUNTER — Encounter (HOSPITAL_BASED_OUTPATIENT_CLINIC_OR_DEPARTMENT_OTHER): Payer: Self-pay

## 2016-02-19 SURGERY — REPAIR, HERNIA, UMBILICAL, ADULT
Anesthesia: General

## 2016-03-01 ENCOUNTER — Encounter (HOSPITAL_BASED_OUTPATIENT_CLINIC_OR_DEPARTMENT_OTHER): Payer: Self-pay | Admitting: *Deleted

## 2016-03-03 ENCOUNTER — Other Ambulatory Visit: Payer: Self-pay | Admitting: Otolaryngology

## 2016-03-03 MED FILL — DYMISTA NASAL SPRAY: 137-50 | 30 days supply | Qty: 23 | Fill #3

## 2016-03-03 NOTE — H&P (Signed)
PREOPERATIVE H&P  Chief Complaint: chronic nasal obstruction and frequent sinus infections  HPI: Vanessa [name] is a 40 y.o. female who presents for evaluation of chronic nasal obstruction and frequent sinus infections. She gets sinus infections almost monthly and has been on several rounds of antibiotics. Recent CT scan demonstrated diffuse sinus disease with mucoperiosteal thickening and small AF levels along with severe septal deformity. She's taken to the OR for septoplasty , turbinate reductions and FESS.  Past Medical History  Diagnosis Date  . Abnormal Pap smear   . AMA (advanced maternal age) multigravida 40+   . Hx of varicella   . Gestational diabetes    Past Surgical History  Procedure Laterality Date  . Leep    . Breast enhancement surgery    . Back surgery     Social History   Social History  . Marital Status: Married    Spouse Name: N/A  . Number of Children: N/A  . Years of Education: N/A   Social History Main Topics  . Smoking status: Former Smoker    Quit date: 11/24/1996  . Smokeless tobacco: Never Used  . Alcohol Use: No  . Drug Use: No  . Sexual Activity: Yes    Birth Control/ Protection: Pill   Other Topics Concern  . None   Social History Narrative   Family History  Problem Relation Age of Onset  . Rheum arthritis Mother   . Anemia Mother   . Migraines Mother   . Diabetes Father   . Hypertension Father   . Diabetes Maternal Grandmother   . Stroke Maternal Grandfather   . Heart disease Maternal Grandfather   . COPD Paternal Grandfather    Allergies  Allergen Reactions  . Penicillins Hives  . Sulfa Antibiotics Hives   Prior to Admission medications   Medication Sig Start Date End Date Taking? Authorizing Provider  ibuprofen (ADVIL,MOTRIN) 600 MG tablet Take 1 tablet (600 mg total) by mouth every 6 (six) hours as needed for pain. 01/24/13  Yes Gustavo Lah, NP  Nabumetone (RELAFEN PO) Take by mouth.   Yes Historical Provider, MD  PRENATAL  VITAMINS PO Take 1 tablet by mouth daily.    Yes Historical Provider, MD  ranitidine (ZANTAC) 150 MG tablet Take 150 mg by mouth See admin instructions. Takes 150 mg every morning and 150 mg again in evening if needed.   Yes Historical Provider, MD     Positive ROS: per HPI  All other systems have been reviewed and were otherwise negative with the exception of those mentioned in the HPI and as above.  Physical Exam: There were no vitals filed for this visit.  General: Alert, no acute distress Oral: Normal oral mucosa and tonsils Nasal: right septal deformity with large swollen turbinates. Middle meatal swelling. No intranasal polyps noted. Neck: No palpable adenopathy or thyroid nodules Ear: Ear canal is clear with normal appearing TMs Cardiovascular: Regular rate and rhythm, no murmur.  Respiratory: Clear to auscultation Neurologic: Alert and oriented x 3   Assessment/Plan: SEPTAL DEVIATION, TURBINATE HYPERTROPHY, SINUSITIS Plan for Procedure(s): BILATERAL NASAL SEPTOPLASTY WITH TURBINATE REDUCTION TOTAL BILATERAL ETHMOIDECTOMY AND MAXILLARY OSTEA ENLARGEMENT   Melony Overly, MD 03/03/2016 5:12 PM

## 2016-03-08 ENCOUNTER — Ambulatory Visit (HOSPITAL_BASED_OUTPATIENT_CLINIC_OR_DEPARTMENT_OTHER)
Admission: RE | Admit: 2016-03-08 | Discharge: 2016-03-08 | Disposition: A | Discharge status: Home / Self Care | Payer: 59 | Source: Ambulatory Visit | Attending: Otolaryngology | Admitting: Otolaryngology

## 2016-03-08 ENCOUNTER — Ambulatory Visit (HOSPITAL_BASED_OUTPATIENT_CLINIC_OR_DEPARTMENT_OTHER): Payer: 59 | Admitting: Anesthesiology

## 2016-03-08 ENCOUNTER — Encounter (HOSPITAL_BASED_OUTPATIENT_CLINIC_OR_DEPARTMENT_OTHER)
Admission: RE | Disposition: A | Discharge status: Home / Self Care | Payer: Self-pay | Source: Ambulatory Visit | Attending: Otolaryngology

## 2016-03-08 ENCOUNTER — Encounter (HOSPITAL_BASED_OUTPATIENT_CLINIC_OR_DEPARTMENT_OTHER): Payer: Self-pay

## 2016-03-08 DIAGNOSIS — J343 Hypertrophy of nasal turbinates: Secondary | ICD-10-CM | POA: Diagnosis not present

## 2016-03-08 DIAGNOSIS — J329 Chronic sinusitis, unspecified: Secondary | ICD-10-CM | POA: Diagnosis not present

## 2016-03-08 DIAGNOSIS — J342 Deviated nasal septum: Secondary | ICD-10-CM | POA: Diagnosis not present

## 2016-03-08 DIAGNOSIS — E119 Type 2 diabetes mellitus without complications: Secondary | ICD-10-CM | POA: Diagnosis not present

## 2016-03-08 DIAGNOSIS — Z87891 Personal history of nicotine dependence: Secondary | ICD-10-CM | POA: Insufficient documentation

## 2016-03-08 DIAGNOSIS — J331 Polypoid sinus degeneration: Secondary | ICD-10-CM | POA: Diagnosis not present

## 2016-03-08 DIAGNOSIS — J019 Acute sinusitis, unspecified: Secondary | ICD-10-CM | POA: Diagnosis not present

## 2016-03-08 HISTORY — PX: NASAL SINUS SURGERY: SHX719

## 2016-03-08 HISTORY — PX: NASAL SEPTOPLASTY W/ TURBINOPLASTY: SHX2070

## 2016-03-08 SURGERY — SEPTOPLASTY, NOSE, WITH NASAL TURBINATE REDUCTION
Anesthesia: General | Site: Nose | Laterality: Bilateral

## 2016-03-08 MED ORDER — ROCURONIUM BROMIDE 100 MG/10ML IV SOLN
INTRAVENOUS | Status: DC | PRN
  Administered 2016-03-08: 40 mg via INTRAVENOUS

## 2016-03-08 MED ORDER — PHENYLEPHRINE 40 MCG/ML (10ML) SYRINGE FOR IV PUSH (FOR BLOOD PRESSURE SUPPORT)
PREFILLED_SYRINGE | INTRAVENOUS | Status: AC
  Filled 2016-03-08: qty 10

## 2016-03-08 MED ORDER — ARTIFICIAL TEARS OP OINT
TOPICAL_OINTMENT | OPHTHALMIC | Status: AC
  Filled 2016-03-08: qty 3.5

## 2016-03-08 MED ORDER — OXYMETAZOLINE HCL 0.05 % NA SOLN
NASAL | Status: DC | PRN
  Administered 2016-03-08: 1

## 2016-03-08 MED ORDER — MIDAZOLAM HCL 2 MG/2ML IJ SOLN
INTRAMUSCULAR | Status: AC
  Filled 2016-03-08: qty 2

## 2016-03-08 MED ORDER — METHYLPREDNISOLONE ACETATE 80 MG/ML IJ SUSP
INTRAMUSCULAR | Status: DC | PRN
  Administered 2016-03-08: 80 mg

## 2016-03-08 MED ORDER — ATROPINE SULFATE 1 MG/ML IJ SOLN
INTRAMUSCULAR | Status: AC
  Filled 2016-03-08: qty 1

## 2016-03-08 MED ORDER — CEPHALEXIN 500 MG PO CAPS: 500.0000 mg | ORAL_CAPSULE | Freq: Two times a day (BID) | ORAL | Status: AC

## 2016-03-08 MED ORDER — HYDROCODONE-ACETAMINOPHEN 5-325 MG PO TABS: 1.0000 | ORAL_TABLET | Freq: Four times a day (QID) | ORAL | Status: DC | PRN

## 2016-03-08 MED ORDER — EPHEDRINE 5 MG/ML INJ
INTRAVENOUS | Status: AC
  Filled 2016-03-08: qty 10

## 2016-03-08 MED ORDER — SUCCINYLCHOLINE CHLORIDE 200 MG/10ML IV SOSY
PREFILLED_SYRINGE | INTRAVENOUS | Status: AC
  Filled 2016-03-08: qty 10

## 2016-03-08 MED ORDER — PROPOFOL 10 MG/ML IV BOLUS
INTRAVENOUS | Status: DC | PRN
  Administered 2016-03-08: 200 mg via INTRAVENOUS

## 2016-03-08 MED ORDER — DEXAMETHASONE SODIUM PHOSPHATE 10 MG/ML IJ SOLN
INTRAMUSCULAR | Status: AC
  Filled 2016-03-08: qty 1

## 2016-03-08 MED ORDER — LIDOCAINE-EPINEPHRINE 1 %-1:100000 IJ SOLN
INTRAMUSCULAR | Status: AC
  Filled 2016-03-08: qty 1

## 2016-03-08 MED ORDER — METHYLPREDNISOLONE ACETATE 80 MG/ML IJ SUSP
INTRAMUSCULAR | Status: AC
  Filled 2016-03-08: qty 1

## 2016-03-08 MED ORDER — ONDANSETRON HCL 4 MG/2ML IJ SOLN
4.0000 mg | Freq: Once | INTRAMUSCULAR | Status: AC | PRN
  Administered 2016-03-08: 4 mg via INTRAVENOUS

## 2016-03-08 MED ORDER — LIDOCAINE-EPINEPHRINE 1 %-1:100000 IJ SOLN
INTRAMUSCULAR | Status: DC | PRN
  Administered 2016-03-08: 19 mL

## 2016-03-08 MED ORDER — SCOPOLAMINE 1 MG/3DAYS TD PT72: 1.0000 | MEDICATED_PATCH | Freq: Once | TRANSDERMAL | Status: DC | PRN

## 2016-03-08 MED ORDER — NEOSTIGMINE METHYLSULFATE 10 MG/10ML IV SOLN
INTRAVENOUS | Status: DC | PRN
  Administered 2016-03-08: 3 mg via INTRAVENOUS

## 2016-03-08 MED ORDER — HYDROMORPHONE HCL 1 MG/ML IJ SOLN
INTRAMUSCULAR | Status: AC
  Filled 2016-03-08: qty 1

## 2016-03-08 MED ORDER — CEFAZOLIN SODIUM 1-5 GM-% IV SOLN
INTRAVENOUS | Status: DC | PRN
  Administered 2016-03-08: 1 g via INTRAVENOUS

## 2016-03-08 MED ORDER — LIDOCAINE HCL (CARDIAC) 20 MG/ML IV SOLN
INTRAVENOUS | Status: DC | PRN
  Administered 2016-03-08: 30 mg via INTRAVENOUS

## 2016-03-08 MED ORDER — SUFENTANIL CITRATE 50 MCG/ML IV SOLN
INTRAVENOUS | Status: AC
  Filled 2016-03-08: qty 1

## 2016-03-08 MED ORDER — LACTATED RINGERS IV SOLN
INTRAVENOUS | Status: DC
  Administered 2016-03-08 (×3): via INTRAVENOUS

## 2016-03-08 MED ORDER — ONDANSETRON HCL 4 MG/2ML IJ SOLN
INTRAMUSCULAR | Status: AC
  Filled 2016-03-08: qty 2

## 2016-03-08 MED ORDER — CIPROFLOXACIN IN D5W 400 MG/200ML IV SOLN
400.0000 mg | INTRAVENOUS | Status: AC
  Administered 2016-03-08 (×2): 400 mg via INTRAVENOUS

## 2016-03-08 MED ORDER — MIDAZOLAM HCL 2 MG/2ML IJ SOLN
1.0000 mg | INTRAMUSCULAR | Status: DC | PRN
  Administered 2016-03-08: 2 mg via INTRAVENOUS

## 2016-03-08 MED ORDER — OXYMETAZOLINE HCL 0.05 % NA SOLN
NASAL | Status: AC
  Filled 2016-03-08: qty 15

## 2016-03-08 MED ORDER — GLYCOPYRROLATE 0.2 MG/ML IJ SOLN
0.2000 mg | Freq: Once | INTRAMUSCULAR | Status: AC | PRN
  Administered 2016-03-08: 0.4 mg via INTRAVENOUS

## 2016-03-08 MED ORDER — ONDANSETRON HCL 4 MG/2ML IJ SOLN
INTRAMUSCULAR | Status: DC | PRN
  Administered 2016-03-08: 4 mg via INTRAVENOUS

## 2016-03-08 MED ORDER — SUFENTANIL CITRATE 50 MCG/ML IV SOLN
INTRAVENOUS | Status: DC | PRN
  Administered 2016-03-08: 30 ug via INTRAVENOUS

## 2016-03-08 MED ORDER — OXYCODONE HCL 5 MG PO TABS: 5.0000 mg | ORAL_TABLET | Freq: Once | ORAL | Status: DC | PRN

## 2016-03-08 MED ORDER — LIDOCAINE 2% (20 MG/ML) 5 ML SYRINGE
INTRAMUSCULAR | Status: AC
  Filled 2016-03-08: qty 5

## 2016-03-08 MED ORDER — CIPROFLOXACIN IN D5W 400 MG/200ML IV SOLN
INTRAVENOUS | Status: AC
  Filled 2016-03-08: qty 200

## 2016-03-08 MED ORDER — SODIUM CHLORIDE 0.9 % IV SOLN
INTRAVENOUS | Status: DC | PRN
  Administered 2016-03-08: 500 mL

## 2016-03-08 MED ORDER — FENTANYL CITRATE (PF) 100 MCG/2ML IJ SOLN: 50.0000 ug | INTRAMUSCULAR | Status: DC | PRN

## 2016-03-08 MED ORDER — CEFAZOLIN SODIUM-DEXTROSE 2-4 GM/100ML-% IV SOLN
INTRAVENOUS | Status: AC
  Filled 2016-03-08: qty 100

## 2016-03-08 MED ORDER — HYDROMORPHONE HCL 1 MG/ML IJ SOLN
0.2500 mg | INTRAMUSCULAR | Status: DC | PRN
  Administered 2016-03-08 (×2): 0.5 mg via INTRAVENOUS

## 2016-03-08 MED ORDER — PHENYLEPHRINE HCL 10 MG/ML IJ SOLN
INTRAMUSCULAR | Status: DC | PRN
  Administered 2016-03-08 (×2): 80 ug via INTRAVENOUS

## 2016-03-08 MED ORDER — MUPIROCIN 2 % EX OINT
TOPICAL_OINTMENT | CUTANEOUS | Status: AC
  Filled 2016-03-08: qty 22

## 2016-03-08 MED ORDER — DEXAMETHASONE SODIUM PHOSPHATE 4 MG/ML IJ SOLN
INTRAMUSCULAR | Status: DC | PRN
  Administered 2016-03-08: 10 mg via INTRAVENOUS

## 2016-03-08 MED ORDER — OXYCODONE HCL 5 MG/5ML PO SOLN: 5.0000 mg | Freq: Once | ORAL | Status: DC | PRN

## 2016-03-08 MED ORDER — MUPIROCIN 2 % EX OINT
TOPICAL_OINTMENT | CUTANEOUS | Status: DC | PRN
  Administered 2016-03-08: 1 via NASAL

## 2016-03-08 MED ORDER — EPHEDRINE SULFATE 50 MG/ML IJ SOLN
INTRAMUSCULAR | Status: DC | PRN
  Administered 2016-03-08: 10 mg via INTRAVENOUS

## 2016-03-08 MED FILL — CEPHALEXIN 500 MG CAPSULE: 500 | 10 days supply | Qty: 20 | Fill #0

## 2016-03-08 MED FILL — HYDROCODON-APAP 5-325: 5-325 | 3 days supply | Qty: 20 | Fill #0

## 2016-03-08 SURGICAL SUPPLY — 48 items
ATTRACTOMAT 16X20 MAGNETIC DRP (DRAPES) ×2 IMPLANT
BLADE INF TURB ROT M4 2 5PK (BLADE) ×2 IMPLANT
BLADE RAD40 ROTATE 4M 4 5PK (BLADE) IMPLANT
BLADE RAD60 ROTATE M4 4 5PK (BLADE) IMPLANT
BLADE TRICUT ROTATE M4 4 5PK (BLADE) ×2 IMPLANT
BUR HS RAD FRONTAL 3 (BURR) IMPLANT
CANISTER SUC SOCK COL 7IN (MISCELLANEOUS) IMPLANT
CANISTER SUCT 1200ML W/VALVE (MISCELLANEOUS) ×2 IMPLANT
COAGULATOR SUCT 8FR VV (MISCELLANEOUS) ×2 IMPLANT
DECANTER SPIKE VIAL GLASS SM (MISCELLANEOUS) IMPLANT
DRESSING NASAL KENNEDY 3.5X.9 (MISCELLANEOUS) IMPLANT
DRSG NASAL KENNEDY 3.5X.9 (MISCELLANEOUS)
DRSG NASAL KENNEDY LMNT 8CM (GAUZE/BANDAGES/DRESSINGS) IMPLANT
DRSG NASOPORE 8CM (GAUZE/BANDAGES/DRESSINGS) ×4 IMPLANT
DRSG TELFA 3X8 NADH (GAUZE/BANDAGES/DRESSINGS) ×2 IMPLANT
ELECT REM PT RETURN 9FT ADLT (ELECTROSURGICAL) ×2
ELECTRODE REM PT RTRN 9FT ADLT (ELECTROSURGICAL) ×1 IMPLANT
GLOVE BIO SURGEON STRL SZ 6.5 (GLOVE) ×2 IMPLANT
GLOVE BIOGEL PI IND STRL 7.0 (GLOVE) ×1 IMPLANT
GLOVE BIOGEL PI INDICATOR 7.0 (GLOVE) ×1
GLOVE SS BIOGEL STRL SZ 7.5 (GLOVE) ×1 IMPLANT
GLOVE SUPERSENSE BIOGEL SZ 7.5 (GLOVE) ×1
GOWN STRL REUS W/ TWL LRG LVL3 (GOWN DISPOSABLE) ×2 IMPLANT
GOWN STRL REUS W/TWL LRG LVL3 (GOWN DISPOSABLE) ×2
HEMOSTAT SURGICEL .5X2 ABSORB (HEMOSTASIS) IMPLANT
HEMOSTAT SURGICEL 2X14 (HEMOSTASIS) IMPLANT
IV NS 500ML (IV SOLUTION) ×1
IV NS 500ML BAXH (IV SOLUTION) ×1 IMPLANT
NEEDLE PRECISIONGLIDE 27X1.5 (NEEDLE) ×2 IMPLANT
NEEDLE SPNL 25GX3.5 QUINCKE BL (NEEDLE) IMPLANT
NS IRRIG 1000ML POUR BTL (IV SOLUTION) IMPLANT
PACK BASIN DAY SURGERY FS (CUSTOM PROCEDURE TRAY) ×2 IMPLANT
PACK ENT DAY SURGERY (CUSTOM PROCEDURE TRAY) ×2 IMPLANT
PATTIES SURGICAL .5 X3 (DISPOSABLE) ×2 IMPLANT
SHEET SILASTIC 8X6X.030 25-30 (MISCELLANEOUS) IMPLANT
SLEEVE SCD COMPRESS KNEE MED (MISCELLANEOUS) ×2 IMPLANT
SOLUTION ANTI FOG 6CC (MISCELLANEOUS) ×2 IMPLANT
SPONGE GAUZE 2X2 8PLY STRL LF (GAUZE/BANDAGES/DRESSINGS) ×2 IMPLANT
SUT CHROMIC 4 0 PS 2 18 (SUTURE) ×2 IMPLANT
SUT ETHILON 3 0 PS 1 (SUTURE) IMPLANT
SUT SILK 2 0 FS (SUTURE) IMPLANT
SUT VIC AB 4-0 P-3 18XBRD (SUTURE) IMPLANT
SUT VIC AB 4-0 P3 18 (SUTURE)
SYR 3ML 18GX1 1/2 (SYRINGE) IMPLANT
TOWEL OR 17X24 6PK STRL BLUE (TOWEL DISPOSABLE) ×4 IMPLANT
TRAY DSU PREP LF (CUSTOM PROCEDURE TRAY) ×2 IMPLANT
TUBE CONNECTING 20X1/4 (TUBING) IMPLANT
YANKAUER SUCT BULB TIP NO VENT (SUCTIONS) ×2 IMPLANT

## 2016-03-08 NOTE — Anesthesia Postprocedure Evaluation (Signed)
Anesthesia Post Note  Patient: Vanessa [name]  Procedure(s) Performed: Procedure(s) (LRB): BILATERAL NASAL SEPTOPLASTY WITH TURBINATE REDUCTION (Bilateral) TOTAL BILATERAL ETHMOIDECTOMY AND MAXILLARY OSTEA ENLARGEMENT (Bilateral)  Patient location during evaluation: PACU Anesthesia Type: General Level of consciousness: awake, awake and alert and oriented Pain management: pain level controlled Vital Signs Assessment: post-procedure vital signs reviewed and stable Respiratory status: spontaneous breathing, nonlabored ventilation and respiratory function stable Cardiovascular status: blood pressure returned to baseline Anesthetic complications: no    Last Vitals:  Filed Vitals:   03/08/16 1330 03/08/16 1345  BP:  128/81  Pulse: 70 76  Temp:  36.7 C  Resp:  16    Last Pain:  Filed Vitals:   03/08/16 1346  PainSc: 0-No pain                 Johnye Kist COKER

## 2016-03-08 NOTE — Anesthesia Procedure Notes (Signed)
Procedure Name: Intubation Date/Time: 03/08/2016 9:04 AM Performed by: Melynda Ripple D Pre-anesthesia Checklist: Patient identified, Emergency Drugs available, Suction available and Patient being monitored Patient Re-evaluated:Patient Re-evaluated prior to inductionOxygen Delivery Method: Circle system utilized Preoxygenation: Pre-oxygenation with 100% oxygen Intubation Type: IV induction Ventilation: Mask ventilation without difficulty Laryngoscope Size: Mac and 3 Grade View: Grade I Tube type: Oral Number of attempts: 1 Airway Equipment and Method: Stylet and Oral airway Placement Confirmation: ETT inserted through vocal cords under direct vision,  positive ETCO2 and breath sounds checked- equal and bilateral Secured at: 23 cm Tube secured with: Tape Dental Injury: Teeth and Oropharynx as per pre-operative assessment

## 2016-03-08 NOTE — Brief Op Note (Signed)
03/08/2016  11:58 AM  PATIENT:  Vanessa [name]  40 y.o. female  PRE-OPERATIVE DIAGNOSIS:  SEPTAL DEVIATION, TURBINATE HYPERTROPHY, SINUSITIS  POST-OPERATIVE DIAGNOSIS:  SEPTAL DEVIATION, TURBINATE HYPERTROPHY, SINUSITIS  PROCEDURE:  Procedure(s): BILATERAL NASAL SEPTOPLASTY WITH TURBINATE REDUCTION (Bilateral) TOTAL BILATERAL ETHMOIDECTOMY AND MAXILLARY OSTEA ENLARGEMENT (Bilateral)  SURGEON:  Surgeon(s) and Role:    * Rozetta Nunnery, MD - Primary  PHYSICIAN ASSISTANT:   ASSISTANTS: none   ANESTHESIA:   general  EBL:  Total I/O In: 1000 [I.V.:1000] Out: 100 [Blood:100]  BLOOD ADMINISTERED:none  DRAINS: none   LOCAL MEDICATIONS USED:  LIDOCAINE with EPI 18 cc  SPECIMEN:  No Specimen  DISPOSITION OF SPECIMEN:  N/A  COUNTS:  YES  TOURNIQUET:  * No tourniquets in log *  DICTATION: .Other Dictation: Dictation Number [id number]  PLAN OF CARE: Discharge to home after PACU  PATIENT DISPOSITION:  PACU - hemodynamically stable.   Delay start of Pharmacological VTE agent (>24hrs) due to surgical blood loss or risk of bleeding: yes

## 2016-03-08 NOTE — Interval H&P Note (Signed)
History and Physical Interval Note:  03/08/2016 8:22 AM  Vanessa [name]  has presented today for surgery, with the diagnosis of SEPTAL DEVIATION, TURBINATE HYPERTROPHY, SINUSITIS  The various methods of treatment have been discussed with the patient and family. After consideration of risks, benefits and other options for treatment, the patient has consented to  Procedure(s): BILATERAL NASAL SEPTOPLASTY WITH TURBINATE REDUCTION (Bilateral) TOTAL BILATERAL ETHMOIDECTOMY AND MAXILLARY OSTEA ENLARGEMENT (Bilateral) as a surgical intervention .  The patient's history has been reviewed, patient examined, no change in status, stable for surgery.  I have reviewed the patient's chart and labs.  Questions were answered to the patient's satisfaction.     Mert Dietrick

## 2016-03-08 NOTE — Discharge Instructions (Addendum)
° ° °  Elevate head of bed and apply cool compress to nose to reduce swelling and bleeding Start Keflex 500 mg twice per day tonight Return to see Dr Lucia Gaskins at his office tomorrow at 11:30  To have your nasal packing removed    Post Anesthesia Home Care Instructions  Activity: Get plenty of rest for the remainder of the day. A responsible adult should stay with you for 24 hours following the procedure.  For the next 24 hours, DO NOT: -Drive a car -Paediatric nurse -Drink alcoholic beverages -Take any medication unless instructed by your physician -Make any legal decisions or sign important papers.  Meals: Start with liquid foods such as gelatin or soup. Progress to regular foods as tolerated. Avoid greasy, spicy, heavy foods. If nausea and/or vomiting occur, drink only clear liquids until the nausea and/or vomiting subsides. Call your physician if vomiting continues.  Special Instructions/Symptoms: Your throat may feel dry or sore from the anesthesia or the breathing tube placed in your throat during surgery. If this causes discomfort, gargle with warm salt water. The discomfort should disappear within 24 hours.  If you had a scopolamine patch placed behind your ear for the management of post- operative nausea and/or vomiting:  1. The medication in the patch is effective for 72 hours, after which it should be removed.  Wrap patch in a tissue and discard in the trash. Wash hands thoroughly with soap and water. 2. You may remove the patch earlier than 72 hours if you experience unpleasant side effects which may include dry mouth, dizziness or visual disturbances. 3. Avoid touching the patch. Wash your hands with soap and water after contact with the patch.

## 2016-03-08 NOTE — Transfer of Care (Signed)
Immediate Anesthesia Transfer of Care Note  Patient: Vanessa [name]  Procedure(s) Performed: Procedure(s): BILATERAL NASAL SEPTOPLASTY WITH TURBINATE REDUCTION (Bilateral) TOTAL BILATERAL ETHMOIDECTOMY AND MAXILLARY OSTEA ENLARGEMENT (Bilateral)  Patient Location: PACU  Anesthesia Type:General  Level of Consciousness: awake, alert  and oriented  Airway & Oxygen Therapy: Patient Spontanous Breathing and Patient connected to face mask oxygen  Post-op Assessment: Report given to RN and Post -op Vital signs reviewed and stable  Post vital signs: Reviewed and stable  Last Vitals:  Filed Vitals:   03/08/16 0814  BP: 109/70  Pulse: 56  Temp: 36.7 C  Resp: 20    Last Pain: There were no vitals filed for this visit.       Complications: No apparent anesthesia complications

## 2016-03-08 NOTE — Anesthesia Preprocedure Evaluation (Signed)
Anesthesia Evaluation  Patient identified by MRN, date of birth, ID band Patient awake    Reviewed: Allergy & Precautions, NPO status , Patient's Chart, lab work & pertinent test results  Airway Mallampati: II  TM Distance: >3 FB Neck ROM: Full    Dental  (+) Teeth Intact, Dental Advisory Given   Pulmonary former smoker,    breath sounds clear to auscultation       Cardiovascular  Rhythm:Regular Rate:Normal     Neuro/Psych    GI/Hepatic   Endo/Other  diabetes  Renal/GU      Musculoskeletal   Abdominal   Peds  Hematology   Anesthesia Other Findings   Reproductive/Obstetrics                             Anesthesia Physical Anesthesia Plan  ASA: I  Anesthesia Plan: General   Post-op Pain Management:    Induction: Intravenous  Airway Management Planned: Oral ETT  Additional Equipment:   Intra-op Plan:   Post-operative Plan: Extubation in OR  Informed Consent: I have reviewed the patients History and Physical, chart, labs and discussed the procedure including the risks, benefits and alternatives for the proposed anesthesia with the patient or authorized representative who has indicated his/her understanding and acceptance.   Dental advisory given  Plan Discussed with: CRNA and Anesthesiologist  Anesthesia Plan Comments:         Anesthesia Quick Evaluation

## 2016-03-09 ENCOUNTER — Encounter (HOSPITAL_BASED_OUTPATIENT_CLINIC_OR_DEPARTMENT_OTHER): Payer: Self-pay | Admitting: Otolaryngology

## 2016-03-09 NOTE — Op Note (Signed)
NAMESCOTTI, KIDDOO NO.:  [id number]  MEDICAL RECORD NO.:  [id number]  LOCATION:                                 FACILITY:  PHYSICIAN:  Leonides Sake. 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  OPERATION PERFORMED:  Septoplasty with bilateral inferior turbinate reductions with [employer] turbinate blade.  Partial left inferior turbinate amputation.  Functional endoscopic sinus surgery with bilateral total ethmoidectomies and bilateral maxillary ostial enlargement.  SURGEON:  Leonides Sake. Lucia Gaskins, M.D.  ESTIMATED BLOOD LOSS:  250 mL.  COMPLICATIONS:  None.  ANESTHESIA:  General endotracheal anesthesia.  BRIEF CLINICAL NOTE:  Vanessa [name] is a 40 year old female who has had chronic problems with nasal congestion as well as pressure in her face and frequent sinus infections.  She states that she gets infections about every month or 2.  When she gets an infection, she gets yellow- green discharge, takes rounds of antibiotics and gently clears up, but her main problem seems to be more trouble breathing through her nose, inspection on the left side.  On exam, she has a septal deviation more to the right, but large compensatory turbinate hypertrophy on the left side.  There are no interface nasal polyps noted; however, on CT scan, this showed bilateral ethmoid maxillary disease and little bit of left frontal disease.  Because of history of chronic nasal obstruction as well as recurrent sinus infections, she was  taken to the operating room at this time for septoplasty, turbinate reductions and functional endoscopic sinus surgery.  DESCRIPTION OF PROCEDURE:  After adequate endotracheal anesthesia, nose was examined and prepped with Betadine solution and draped out with sterile towels.  The nose was then further prepped with cotton pledgets soaked in Afrin and septum turbinates and middle meatus were injected with Xylocaine with epinephrine for hemostasis.  On exam, the patient has a deviation of the septum more to the right with a large septal spur posteriorly on the right side.  She has compensatory turbinate hypertrophy on the left side.  She also has small area of the cartilaginous septum bowing off the maxillary crest into the left inferior airway.  A hemitransfixion incision was made along the caudal edge of septum on the right side.  Mucoperichondrial and mucoperiosteal flaps were elevated posteriorly.  Of note, the cartilaginous septum was very thin and mobile.  A vertical incision was made just anterior to the bony septum posteriorly and the bony septum protruded more to the right side.  The large septal spur posteriorly on the right side was removed along with some of the vomer bone.  This allowed the septum to return more toward midline and allowed better access to the right middle meatus area.  In addition, a small strip of cartilaginous septum from the left  inferior cartilaginous septum was removed and about 1-2 mm strip of caudal cartilaginous septum was removed and protruded little bit more to the left side.  This completed the septoplasty portion of the procedure. Next, using the endoscopes, the right middle meatus was examined first. The middle turbinate was outfractured.  The uncinate process was incised and removed.  The maxillary ostia were identified and was enlarged with backbiting and straight Thru-Cut forceps from approximately 1-2 mm size to 8-9 mm size.  Using the  microdebrider, anterior ethmoid area was opened up and the posterior ethmoid area was opened on the right side, but there was really minimal disease on the right side.  Next, the left side was approached.  Again, the middle turbinate, which was larger was outfractured.  The uncinate process was incised with sickle knife and removed.  The maxillary ostia on the left side was identified, was enlarged with backbiting and straight Thru-Cut forceps.  Again, large to about 8-10 mm size.  The anterior ethmoid area was opened up.  In addition, the posterior ethmoid area opened up with a microdebrider.  Of note, anterior-superiorly, there was some polypoid disease in the region of the nasal frontal duct area and this was removed with small straight Thru and up-cutting forceps.  She had little bit more bleeding on the left side, especially anterior superiorly toward the nasal frontal area. Although, the nasal frontal duct was really not explored.  This completed the left side, packed with Afrin was placed for hemostasis. Following completion of the functional endoscopic sinus surgery, the hemitransfixion incision was closed with interrupted 4-0 chromic sutures.  The septum was basted with a 4-0 chromic suture.  Splints were secured to either side of the septum with 3-0 nylon suture.  NasoPore was packed in the middle meatus on both sides.  The nose was then further packed with Telfa soaked in Bactroban 2% ointment.  Prior to packing the nose, 80 mg of Depo-Medrol were injected into the middle turbinates on both sides.  This completed the procedure.  The patient was awoken from anesthesia and transferred to the recovery room, postoperatively doing well.  DISPOSITION:  Vanessa [name] was discharged home later this morning on Keflex 500 mg b.i.d. for 10 days, Tylenol and Vicodin p.r.n. pain.  I will have her follow up in office tomorrow to have her nasal packs  removed.    ______________________________ Leonides Sake Lucia Gaskins, M.D.   ______________________________ Leonides Sake. Lucia Gaskins, M.D.    CEN/MEDQ  D:  03/08/2016  T:  03/08/2016  Job:  [id number]

## 2016-03-14 MED FILL — FLUoxetine HCL 10 MG CAPS: 10 | 90 days supply | Qty: 90 | Fill #2

## 2016-03-14 MED FILL — INTROVALE 0.15-0.03 MG TAB: 0.15-0.03 | 90 days supply | Qty: 91 | Fill #2

## 2016-05-20 DIAGNOSIS — H524 Presbyopia: Secondary | ICD-10-CM | POA: Diagnosis not present

## 2016-06-09 MED FILL — INTROVALE 0.15-0.03 MG TAB: 0.15-0.03 | 90 days supply | Qty: 91 | Fill #3

## 2016-06-10 MED FILL — FLUoxetine HCL 10 MG CAPS: 10 | 90 days supply | Qty: 90 | Fill #3

## 2016-06-27 ENCOUNTER — Other Ambulatory Visit: Payer: Self-pay | Admitting: Certified Nurse Midwife

## 2016-06-27 DIAGNOSIS — Z1231 Encounter for screening mammogram for malignant neoplasm of breast: Secondary | ICD-10-CM

## 2016-07-04 ENCOUNTER — Ambulatory Visit
Admission: RE | Admit: 2016-07-04 | Discharge: 2016-07-04 | Disposition: A | Discharge status: Home / Self Care | Payer: 59 | Source: Ambulatory Visit | Attending: Certified Nurse Midwife | Admitting: Certified Nurse Midwife

## 2016-07-04 DIAGNOSIS — J029 Acute pharyngitis, unspecified: Secondary | ICD-10-CM | POA: Diagnosis not present

## 2016-07-04 DIAGNOSIS — Z1231 Encounter for screening mammogram for malignant neoplasm of breast: Secondary | ICD-10-CM

## 2016-07-04 MED FILL — AZITHROMYCIN 250 MG TABLET: 250 | 5 days supply | Qty: 6 | Fill #0

## 2016-07-14 ENCOUNTER — Ambulatory Visit: Payer: 59

## 2016-08-10 DIAGNOSIS — F4320 Adjustment disorder, unspecified: Secondary | ICD-10-CM | POA: Diagnosis not present

## 2016-08-31 DIAGNOSIS — F4320 Adjustment disorder, unspecified: Secondary | ICD-10-CM | POA: Diagnosis not present

## 2016-09-05 MED FILL — INTROVALE 0.15-0.03 MG TAB: 0.15-0.03 | 90 days supply | Qty: 91 | Fill #4

## 2016-09-13 DIAGNOSIS — F4320 Adjustment disorder, unspecified: Secondary | ICD-10-CM | POA: Diagnosis not present

## 2016-09-16 DIAGNOSIS — L570 Actinic keratosis: Secondary | ICD-10-CM | POA: Diagnosis not present

## 2016-10-11 DIAGNOSIS — F4320 Adjustment disorder, unspecified: Secondary | ICD-10-CM | POA: Diagnosis not present

## 2016-10-21 DIAGNOSIS — M5412 Radiculopathy, cervical region: Secondary | ICD-10-CM | POA: Diagnosis not present

## 2016-10-21 DIAGNOSIS — M5030 Other cervical disc degeneration, unspecified cervical region: Secondary | ICD-10-CM | POA: Diagnosis not present

## 2016-10-21 DIAGNOSIS — M4722 Other spondylosis with radiculopathy, cervical region: Secondary | ICD-10-CM | POA: Diagnosis not present

## 2016-10-21 DIAGNOSIS — G5602 Carpal tunnel syndrome, left upper limb: Secondary | ICD-10-CM | POA: Diagnosis not present

## 2016-10-21 DIAGNOSIS — Z981 Arthrodesis status: Secondary | ICD-10-CM | POA: Diagnosis not present

## 2016-10-21 MED FILL — NABUMETONE 500 MG TABLET: 500 | 30 days supply | Qty: 60 | Fill #0

## 2016-10-22 DIAGNOSIS — M542 Cervicalgia: Secondary | ICD-10-CM | POA: Diagnosis not present

## 2016-10-22 DIAGNOSIS — M4722 Other spondylosis with radiculopathy, cervical region: Secondary | ICD-10-CM | POA: Diagnosis not present

## 2016-10-26 DIAGNOSIS — F4320 Adjustment disorder, unspecified: Secondary | ICD-10-CM | POA: Diagnosis not present

## 2016-11-01 DIAGNOSIS — G5602 Carpal tunnel syndrome, left upper limb: Secondary | ICD-10-CM | POA: Diagnosis not present

## 2016-11-01 DIAGNOSIS — M5412 Radiculopathy, cervical region: Secondary | ICD-10-CM | POA: Diagnosis not present

## 2016-11-02 DIAGNOSIS — M5030 Other cervical disc degeneration, unspecified cervical region: Secondary | ICD-10-CM | POA: Diagnosis not present

## 2016-11-02 DIAGNOSIS — G5602 Carpal tunnel syndrome, left upper limb: Secondary | ICD-10-CM | POA: Diagnosis not present

## 2016-11-02 DIAGNOSIS — M4722 Other spondylosis with radiculopathy, cervical region: Secondary | ICD-10-CM | POA: Diagnosis not present

## 2016-11-02 DIAGNOSIS — M5412 Radiculopathy, cervical region: Secondary | ICD-10-CM | POA: Diagnosis not present

## 2016-11-02 DIAGNOSIS — R030 Elevated blood-pressure reading, without diagnosis of hypertension: Secondary | ICD-10-CM | POA: Diagnosis not present

## 2016-11-02 MED FILL — FLUoxetine HCL 10 MG CAPS: 10 | 30 days supply | Qty: 30 | Fill #0

## 2016-11-09 DIAGNOSIS — Z01419 Encounter for gynecological examination (general) (routine) without abnormal findings: Secondary | ICD-10-CM | POA: Diagnosis not present

## 2016-11-09 DIAGNOSIS — Z6822 Body mass index (BMI) 22.0-22.9, adult: Secondary | ICD-10-CM | POA: Diagnosis not present

## 2016-11-09 MED FILL — SPIRONOLACTONE 50 MG TABLET: 50 | 90 days supply | Qty: 90 | Fill #0

## 2016-11-24 DIAGNOSIS — L578 Other skin changes due to chronic exposure to nonionizing radiation: Secondary | ICD-10-CM | POA: Diagnosis not present

## 2016-11-24 DIAGNOSIS — L300 Nummular dermatitis: Secondary | ICD-10-CM | POA: Diagnosis not present

## 2016-11-24 DIAGNOSIS — D1801 Hemangioma of skin and subcutaneous tissue: Secondary | ICD-10-CM | POA: Diagnosis not present

## 2016-12-14 MED FILL — FLUoxetine HCL 10 MG CAPS: 10 | 90 days supply | Qty: 90 | Fill #0

## 2016-12-14 MED FILL — LEVONOR-ETH ESTRAD 0.15-0.0: 0.15-0.03 | 91 days supply | Qty: 91 | Fill #0

## 2017-01-04 ENCOUNTER — Telehealth: Payer: 59 | Admitting: Family

## 2017-01-04 DIAGNOSIS — R05 Cough: Secondary | ICD-10-CM

## 2017-01-04 DIAGNOSIS — R059 Cough, unspecified: Secondary | ICD-10-CM

## 2017-01-04 MED ORDER — BENZONATATE 100 MG PO CAPS: 100.0000 mg | ORAL_CAPSULE | Freq: Three times a day (TID) | ORAL | 0 refills | Status: DC | PRN

## 2017-01-04 MED ORDER — AZITHROMYCIN 250 MG PO TABS: ORAL_TABLET | ORAL | 0 refills | Status: DC

## 2017-01-04 MED FILL — BENZONATATE 100 MG CAP: 100 | 6 days supply | Qty: 20 | Fill #0

## 2017-01-04 MED FILL — AZITHROMYCIN 250 MG TAB: 250 | 5 days supply | Qty: 6 | Fill #0

## 2017-01-04 NOTE — Progress Notes (Signed)

## 2017-02-28 DIAGNOSIS — M4722 Other spondylosis with radiculopathy, cervical region: Secondary | ICD-10-CM | POA: Diagnosis not present

## 2017-02-28 DIAGNOSIS — M5412 Radiculopathy, cervical region: Secondary | ICD-10-CM | POA: Diagnosis not present

## 2017-02-28 DIAGNOSIS — M5030 Other cervical disc degeneration, unspecified cervical region: Secondary | ICD-10-CM | POA: Diagnosis not present

## 2017-03-06 DIAGNOSIS — F4320 Adjustment disorder, unspecified: Secondary | ICD-10-CM | POA: Diagnosis not present

## 2017-03-13 MED FILL — SETLAKIN 0.15 MG-0.03 MG TA: 0.15-0.03 | 91 days supply | Qty: 91 | Fill #1

## 2017-03-13 MED FILL — NABUMETONE 500 MG TABLET: 500 | 30 days supply | Qty: 60 | Fill #1

## 2017-03-14 DIAGNOSIS — F4320 Adjustment disorder, unspecified: Secondary | ICD-10-CM | POA: Diagnosis not present

## 2017-03-30 ENCOUNTER — Telehealth: Payer: 59 | Admitting: Physician Assistant

## 2017-03-30 DIAGNOSIS — L237 Allergic contact dermatitis due to plants, except food: Secondary | ICD-10-CM | POA: Diagnosis not present

## 2017-03-30 DIAGNOSIS — L255 Unspecified contact dermatitis due to plants, except food: Secondary | ICD-10-CM

## 2017-03-30 MED ORDER — PREDNISONE 10 MG (21) PO TBPK: ORAL_TABLET | ORAL | 0 refills | Status: DC

## 2017-03-30 MED FILL — predniSONE 10 MG (21) TBPK: 10 | 6 days supply | Qty: 21 | Fill #0

## 2017-03-30 NOTE — Progress Notes (Signed)

## 2017-04-03 MED FILL — FLUoxetine HCL 10 MG CAPS: 10 | 90 days supply | Qty: 90 | Fill #1

## 2017-04-10 MED FILL — FLUCONAZOLE 150 MG TABLET: 150 | 7 days supply | Qty: 3 | Fill #0

## 2017-04-27 DIAGNOSIS — F4320 Adjustment disorder, unspecified: Secondary | ICD-10-CM | POA: Diagnosis not present

## 2017-05-19 ENCOUNTER — Telehealth: Payer: 59 | Admitting: Family

## 2017-05-19 DIAGNOSIS — L237 Allergic contact dermatitis due to plants, except food: Secondary | ICD-10-CM | POA: Diagnosis not present

## 2017-05-19 MED ORDER — PREDNISONE 10 MG (21) PO TBPK: ORAL_TABLET | ORAL | 0 refills | Status: DC

## 2017-05-19 MED FILL — predniSONE 10 MG (21) TBPK: 10 | 6 days supply | Qty: 21 | Fill #0

## 2017-05-19 NOTE — Progress Notes (Signed)
Thank you for the details you put in the comment boxes. Those details really help us take better care of you.   We are sorry that you are not feeing well.  Here is how we plan to help!  Based on what you have shared with me it looks like you have had an allergic reaction to the oily resin from a group of plants.  This resin is very sticky, so it easily attaches to your skin, clothing, tools equipment, and pet's fur.    This blistering rash is often called poison ivy rash although it can come from contact with the leaves, stems and roots of poison ivy, poison oak and poison sumac.  The oily resin contains urushiol (u-ROO-she-ol) that produces a skin rash on exposed skin.  The severity of the rash depends on the amount of urushiol that gets on your skin.  A section of skin with more urushiol on it may develop a rash sooner.  The rash usually develops 12-48 hours after exposure and can last two to three weeks.  Your skin must come in direct contact with the plant's oil to be affected.  Blister fluid doesn't spread the rash.  However, if you come into contact with a piece of clothing or pet fur that has urushiol on it, the rash may spread out.  You can also transfer the oil to other parts of your body with your fingers.  Often the rash looks like a straight line because of the way the plant brushes against your skin.    I have developed the following plan to treat your condition Since your rash is widespread or has resulted in a large number of blisters, I have prescribed an oral corticosteroid.  Please follow these recommendations:  I have sent a prednisone dose pack to your chosen pharmacy. Be sure to follow the instructions carefully and complete the entire prescription. You may use Benadryl or Caladryl topical lotions to sooth the itch and remember cool, not hot, showers and baths can help relieve the itching!  Place cool, wet compresses on the affected area for 15-30 minutes several times a day.  You  may also take oral antihistamines, such as diphenhydramine (Benadryl, others), which may also help you sleep better.  Watch your skin for any purulent (pus) drainage or red streaking from the site.  If this occurs, contact your provider.  You may require an antibiotic for a skin infection.  Make sure that the clothes you were wearing as well as any towels or sheets that may have come in contact with the oil (urushiol) are washed in detergent and hot water.         What can you do to prevent this rash?  Avoid the plants.  Learn how to identify poison ivy, poison oak and poison sumac in all seasons.  When hiking or engaging in other activities that might expose you to these plants, try to stay on cleared pathways.  If camping, make sure you pitch your tent in an area free of these plants.  Keep pets from running through wooded areas so that urushiol doesn't accidentally stick to their fur, which you may touch.  Remove or kill the plants.  In your yard, you can get rid of poison ivy by applying an herbicide or pulling it out of the ground, including the roots, while wearing heavy gloves.  Afterward remove the gloves and thoroughly wash them and your hands.  Don't burn poison ivy or related plants because   the urushiol can be carried by smoke.  Wear protective clothing.  If needed, protect your skin by wearing socks, boots, pants, long sleeves and vinyl gloves.  Wash your skin right away.  Washing off the oil with soap and water within 30 minutes of exposure may reduce your chances of getting a poison ivy rash.  Even washing after an hour or so can help reduce the severity of the rash.  If you walk through some poison ivy and then later touch your shoes, you may get some urushiol on your hands, which may then transfer to your face or body by touching or rubbing.  If the contaminated object isn't cleaned, the urushiol on it can still cause a skin reaction years later.    Be careful not to reuse towels  after you have washed your skin.  Also carefully wash clothing in detergent and hot water to remove all traces of the oil.  Handle contaminated clothing carefully so you don't transfer the urushiol to yourself, furniture, rugs or appliances.  Remember that pets can carry the oil on their fur and paws.  If you think your pet may be contaminated with urushiol, put on some long rubber gloves and give your pet a bath.  Finally, be careful not to burn these plants as the smoke can contain traces of the oil.  Inhaling the smoke may result in difficulty breathing. If that occurred you should see a physician as soon as possible.  See your doctor right away if:   The reaction is severe or widespread  You inhaled the smoke from burning poison ivy and are having difficulty breathing  Your skin continues to swell  The rash affects your eyes, mouth or genitals  Blisters are oozing pus  You develop a fever greater than 100 F (37.8 C)  The rash doesn't get better within a few weeks.  If you scratch the poison ivy rash, bacteria under your fingernails may cause the skin to become infected.  See your doctor if pus starts oozing from the blisters.  Treatment generally includes antibiotics.  Poison ivy treatments are usually limited to self-care methods.  And the rash typically goes away on its own in two to three weeks.     If the rash is widespread or results in a large number of blisters, your doctor may prescribe an oral corticosteroid, such as prednisone.  If a bacterial infection has developed at the rash site, your doctor may give you a prescription for an oral antibiotic.  MAKE SURE YOU   Understand these instructions.  Will watch your condition.  Will get help right away if you are not doing well or get worse.  Thank you for choosing an e-visit. Your e-visit answers were reviewed by a board certified advanced clinical practitioner to complete your personal care plan. Depending upon the  condition, your plan could have included both over the counter or prescription medications.  Please review your pharmacy choice. If there is a problem you may use MyChart messaging to have the prescription routed to another pharmacy.   Your safety is important to us. If you have drug allergies check your prescription carefully.  You can use MyChart to ask questions about today's visit, request a non-urgent call back, or ask for a work or school excuse for 24 hours related to this e-Visit. If it has been greater than 24 hours you will need to follow up with your provider, or enter a new e-Visit to address those concerns.     You will get an email in the next two days asking about your experience. I hope that your e-visit has been valuable and will speed your recovery Thank you for choosing an e-visit.      

## 2017-05-29 MED FILL — NABUMETONE 500 MG TABLET: 500 | 30 days supply | Qty: 60 | Fill #2

## 2017-06-04 MED FILL — SETLAKIN 0.15 MG-0.03 MG TA: 0.15-0.03 | 91 days supply | Qty: 91 | Fill #2

## 2017-06-05 ENCOUNTER — Other Ambulatory Visit: Payer: Self-pay | Admitting: Certified Nurse Midwife

## 2017-06-05 DIAGNOSIS — H524 Presbyopia: Secondary | ICD-10-CM | POA: Diagnosis not present

## 2017-06-05 DIAGNOSIS — Z1231 Encounter for screening mammogram for malignant neoplasm of breast: Secondary | ICD-10-CM

## 2017-06-05 DIAGNOSIS — H5203 Hypermetropia, bilateral: Secondary | ICD-10-CM | POA: Diagnosis not present

## 2017-07-10 ENCOUNTER — Ambulatory Visit
Admission: RE | Admit: 2017-07-10 | Discharge: 2017-07-10 | Disposition: A | Discharge status: Home / Self Care | Payer: 59 | Source: Ambulatory Visit | Attending: Certified Nurse Midwife | Admitting: Certified Nurse Midwife

## 2017-07-10 DIAGNOSIS — Z1231 Encounter for screening mammogram for malignant neoplasm of breast: Secondary | ICD-10-CM | POA: Diagnosis not present

## 2017-07-25 MED FILL — FLUoxetine HCL 10 MG CAPS: 10 | 90 days supply | Qty: 180 | Fill #0

## 2017-08-16 MED FILL — NABUMETONE 500 MG TABLET: 500 | 30 days supply | Qty: 60 | Fill #3

## 2017-08-25 DIAGNOSIS — F4323 Adjustment disorder with mixed anxiety and depressed mood: Secondary | ICD-10-CM | POA: Diagnosis not present

## 2017-08-30 DIAGNOSIS — F4323 Adjustment disorder with mixed anxiety and depressed mood: Secondary | ICD-10-CM | POA: Diagnosis not present

## 2017-09-04 MED FILL — SETLAKIN 0.15 MG-0.03 MG TA: 0.15-0.03 | 91 days supply | Qty: 91 | Fill #3

## 2017-09-21 DIAGNOSIS — F4323 Adjustment disorder with mixed anxiety and depressed mood: Secondary | ICD-10-CM | POA: Diagnosis not present

## 2017-10-11 DIAGNOSIS — F4323 Adjustment disorder with mixed anxiety and depressed mood: Secondary | ICD-10-CM | POA: Diagnosis not present

## 2017-10-25 DIAGNOSIS — F4323 Adjustment disorder with mixed anxiety and depressed mood: Secondary | ICD-10-CM | POA: Diagnosis not present

## 2017-11-06 MED FILL — NABUMETONE 500 MG TABLET: 500 | 30 days supply | Qty: 60 | Fill #0

## 2017-11-15 DIAGNOSIS — F4323 Adjustment disorder with mixed anxiety and depressed mood: Secondary | ICD-10-CM | POA: Diagnosis not present

## 2017-11-30 DIAGNOSIS — F4323 Adjustment disorder with mixed anxiety and depressed mood: Secondary | ICD-10-CM | POA: Diagnosis not present

## 2017-12-05 DIAGNOSIS — Z113 Encounter for screening for infections with a predominantly sexual mode of transmission: Secondary | ICD-10-CM | POA: Diagnosis not present

## 2017-12-05 DIAGNOSIS — Z1151 Encounter for screening for human papillomavirus (HPV): Secondary | ICD-10-CM | POA: Diagnosis not present

## 2017-12-05 DIAGNOSIS — Z114 Encounter for screening for human immunodeficiency virus [HIV]: Secondary | ICD-10-CM | POA: Diagnosis not present

## 2017-12-05 DIAGNOSIS — Z01419 Encounter for gynecological examination (general) (routine) without abnormal findings: Secondary | ICD-10-CM | POA: Diagnosis not present

## 2017-12-05 DIAGNOSIS — Z6822 Body mass index (BMI) 22.0-22.9, adult: Secondary | ICD-10-CM | POA: Diagnosis not present

## 2017-12-05 DIAGNOSIS — Z1159 Encounter for screening for other viral diseases: Secondary | ICD-10-CM | POA: Diagnosis not present

## 2017-12-05 LAB — HM PAP SMEAR: HM Pap smear: NORMAL

## 2017-12-05 LAB — RESULTS CONSOLE HPV: CHL HPV: NEGATIVE

## 2017-12-05 MED FILL — FLUoxetine HCL 10 MG TABS: 10 | 90 days supply | Qty: 90 | Fill #0

## 2017-12-05 MED FILL — LEVONOR-ETH ESTRAD 0.15-0.0: 0.15-0.03 | 91 days supply | Qty: 91 | Fill #0

## 2017-12-20 MED FILL — NABUMETONE 500 MG TABLET: 500 | 30 days supply | Qty: 60 | Fill #1

## 2018-01-17 DIAGNOSIS — D485 Neoplasm of uncertain behavior of skin: Secondary | ICD-10-CM | POA: Diagnosis not present

## 2018-01-17 DIAGNOSIS — L820 Inflamed seborrheic keratosis: Secondary | ICD-10-CM | POA: Diagnosis not present

## 2018-02-19 DIAGNOSIS — F4323 Adjustment disorder with mixed anxiety and depressed mood: Secondary | ICD-10-CM | POA: Diagnosis not present

## 2018-02-27 MED FILL — LEVONOR-ETH ESTRAD 0.15-0.0: 0.15-0.03 | 91 days supply | Qty: 91 | Fill #1

## 2018-03-01 MED FILL — NABUMETONE 500 MG TABLET: 500 | 30 days supply | Qty: 60 | Fill #2

## 2018-03-01 MED FILL — FLUoxetine HCL 10 MG TABS: 10 | 90 days supply | Qty: 90 | Fill #1

## 2018-03-15 ENCOUNTER — Ambulatory Visit (INDEPENDENT_AMBULATORY_CARE_PROVIDER_SITE_OTHER): Payer: Self-pay | Admitting: Family Medicine

## 2018-03-15 VITALS — BP 110/78 | HR 67 | Temp 98.7°F | Wt 133.0 lb

## 2018-03-15 DIAGNOSIS — Z0000 Encounter for general adult medical examination without abnormal findings: Secondary | ICD-10-CM

## 2018-03-15 NOTE — Progress Notes (Signed)
Vanessa [name] is a 42 y.o. female who presents today with concerns of need for a physical exam. She denies any chronic or acute health conditions to address this visit.  Review of Systems  Constitutional: Negative for chills, fever and malaise/fatigue.  HENT: Negative for congestion, ear discharge, ear pain, sinus pain and sore throat.   Eyes: Negative.   Respiratory: Negative for cough, sputum production and shortness of breath.   Cardiovascular: Negative.  Negative for chest pain.  Gastrointestinal: Negative for abdominal pain, diarrhea, nausea and vomiting.  Genitourinary: Negative for dysuria, frequency, hematuria and urgency.  Musculoskeletal: Negative for myalgias.  Skin: Negative.   Neurological: Negative for headaches.  Endo/Heme/Allergies: Negative.   Psychiatric/Behavioral: Negative.     O: Vitals:   03/15/18 1706  BP: 110/78  Pulse: 67  Temp: 98.7 F (37.1 C)  SpO2: (!) 18%     Physical Exam  Constitutional: She is oriented to person, place, and time. Vital signs are normal. She appears well-developed and well-nourished. She is active.  Non-toxic appearance. She does not have a sickly appearance.  HENT:  Head: Normocephalic.  Right Ear: Hearing, tympanic membrane, external ear and ear canal normal.  Left Ear: Hearing, tympanic membrane, external ear and ear canal normal.  Nose: Nose normal.  Mouth/Throat: Uvula is midline and oropharynx is clear and moist.  Neck: Normal range of motion. Neck supple.  Cardiovascular: Normal rate, regular rhythm, normal heart sounds and normal pulses.  Pulmonary/Chest: Effort normal and breath sounds normal.  Abdominal: Soft. Bowel sounds are normal.  Musculoskeletal: Normal range of motion.  Lymphadenopathy:       Head (right side): No submental and no submandibular adenopathy present.       Head (left side): No submental and no submandibular adenopathy present.    She has no cervical adenopathy.  Neurological: She is alert and  oriented to person, place, and time.  Psychiatric: She has a normal mood and affect. Her speech is normal and behavior is normal. Cognition and memory are normal.  PHQ-9- negative  Vitals reviewed.  A: 1. Physical exam    P: Exam findings, diagnosis etiology and medication use and indications reviewed with patient. Follow- Up and discharge instructions provided. No emergent/urgent issues found on exam.  Patient verbalized understanding of information provided and agrees with plan of care (POC), all questions answered.  1. Physical exam WNL- completed

## 2018-03-15 NOTE — Patient Instructions (Signed)

## 2018-03-28 MED FILL — NABUMETONE 500 MG TABLET: 500 | 30 days supply | Qty: 60 | Fill #3

## 2018-04-17 DIAGNOSIS — M47812 Spondylosis without myelopathy or radiculopathy, cervical region: Secondary | ICD-10-CM | POA: Diagnosis not present

## 2018-04-17 DIAGNOSIS — M542 Cervicalgia: Secondary | ICD-10-CM | POA: Diagnosis not present

## 2018-04-17 DIAGNOSIS — R030 Elevated blood-pressure reading, without diagnosis of hypertension: Secondary | ICD-10-CM | POA: Diagnosis not present

## 2018-04-17 DIAGNOSIS — M5030 Other cervical disc degeneration, unspecified cervical region: Secondary | ICD-10-CM | POA: Diagnosis not present

## 2018-04-24 MED FILL — NABUMETONE 500 MG TABLET: 500 | 30 days supply | Qty: 60 | Fill #4

## 2018-05-23 MED FILL — NABUMETONE 500 MG TABLET: 500 | 30 days supply | Qty: 60 | Fill #5

## 2018-05-23 MED FILL — FLUoxetine HCL 10 MG TABS: 10 | 90 days supply | Qty: 90 | Fill #2

## 2018-05-23 MED FILL — SETLAKIN 0.15 MG-0.03 MG TA: 0.15-0.03 | 91 days supply | Qty: 91 | Fill #2

## 2018-07-18 MED FILL — buPROPion HCL ER (XL) 150 M: 150 | 30 days supply | Qty: 30 | Fill #0

## 2018-08-06 ENCOUNTER — Telehealth: Payer: 59 | Admitting: Family

## 2018-08-06 DIAGNOSIS — J029 Acute pharyngitis, unspecified: Secondary | ICD-10-CM

## 2018-08-06 DIAGNOSIS — J019 Acute sinusitis, unspecified: Secondary | ICD-10-CM

## 2018-08-06 MED ORDER — DOXYCYCLINE HYCLATE 100 MG PO TABS: 100.0000 mg | ORAL_TABLET | Freq: Two times a day (BID) | ORAL | 0 refills | Status: DC

## 2018-08-06 MED FILL — DOXYCYCLINE HYCLATE 100 MG: 100 | 10 days supply | Qty: 20 | Fill #0

## 2018-08-06 NOTE — Progress Notes (Signed)

## 2018-08-10 ENCOUNTER — Telehealth: Payer: 59 | Admitting: Family

## 2018-08-10 DIAGNOSIS — B9689 Other specified bacterial agents as the cause of diseases classified elsewhere: Secondary | ICD-10-CM

## 2018-08-10 DIAGNOSIS — J028 Acute pharyngitis due to other specified organisms: Secondary | ICD-10-CM | POA: Diagnosis not present

## 2018-08-10 MED ORDER — BENZONATATE 100 MG PO CAPS: 100.0000 mg | ORAL_CAPSULE | Freq: Three times a day (TID) | ORAL | 0 refills | Status: DC | PRN

## 2018-08-10 MED ORDER — PREDNISONE 5 MG PO TABS: 5.0000 mg | ORAL_TABLET | ORAL | 0 refills | Status: DC

## 2018-08-10 MED FILL — predniSONE 5 MG (21) TBPK: 5 | 6 days supply | Qty: 21 | Fill #0

## 2018-08-10 MED FILL — BENZONATATE 100 MG CAP: 100 | 5 days supply | Qty: 30 | Fill #0

## 2018-08-10 MED FILL — SETLAKIN 0.15 MG-0.03 MG TA: 0.15-0.03 | 91 days supply | Qty: 91 | Fill #3

## 2018-08-10 NOTE — Progress Notes (Signed)
Thank you for the details you included in the comment boxes. Those details are very helpful in determining the best course of treatment for you and help [place] to provide the best care. Please give the doxy a chance to work as it is 10 days. I will add Tessalon perles as below along with a prednisone pack.  We are sorry that you are not feeling well.  Here is how we plan to help!  Based on your presentation I believe you most likely have a bacterial infection (pharyngitis/sinusitis)    In addition you may use A prescription cough medication called Tessalon Perles 100mg . You may take 1-2 capsules every 8 hours as needed for your cough.  Prednisone 5 mg daily for 6 days (see taper instructions below)  Directions for 6 day taper: Day 1: 2 tablets before breakfast, 1 after both lunch & dinner and 2 at bedtime Day 2: 1 tab before breakfast, 1 after both lunch & dinner and 2 at bedtime Day 3: 1 tab at each meal & 1 at bedtime Day 4: 1 tab at breakfast, 1 at lunch, 1 at bedtime Day 5: 1 tab at breakfast & 1 tab at bedtime Day 6: 1 tab at breakfast   From your responses in the eVisit questionnaire you describe inflammation in the upper respiratory tract which is causing a significant cough.  This is commonly called Bronchitis and has four common causes:    Allergies  Viral Infections  Acid Reflux  Bacterial Infection Allergies, viruses and acid reflux are treated by controlling symptoms or eliminating the cause. An example might be a cough caused by taking certain blood pressure medications. You stop the cough by changing the medication. Another example might be a cough caused by acid reflux. Controlling the reflux helps control the cough.  USE OF BRONCHODILATOR ("RESCUE") INHALERS: There is a risk from using your bronchodilator too frequently.  The risk is that over-reliance on a medication which only relaxes the muscles surrounding the breathing tubes can reduce the effectiveness of medications  prescribed to reduce swelling and congestion of the tubes themselves.  Although you feel brief relief from the bronchodilator inhaler, your asthma may actually be worsening with the tubes becoming more swollen and filled with mucus.  This can delay other crucial treatments, such as oral steroid medications. If you need to use a bronchodilator inhaler daily, several times per day, you should discuss this with your provider.  There are probably better treatments that could be used to keep your asthma under control.     HOME CARE . Only take medications as instructed by your medical team. . Complete the entire course of an antibiotic. . Drink plenty of fluids and get plenty of rest. . Avoid close contacts especially the very young and the elderly . Cover your mouth if you cough or cough into your sleeve. . Always remember to wash your hands . A steam or ultrasonic humidifier can help congestion.   GET HELP RIGHT AWAY IF: . You develop worsening fever. . You become short of breath . You cough up blood. . Your symptoms persist after you have completed your treatment plan MAKE SURE YOU   Understand these instructions.  Will watch your condition.  Will get help right away if you are not doing well or get worse.  Your e-visit answers were reviewed by a board certified advanced clinical practitioner to complete your personal care plan.  Depending on the condition, your plan could have included both over  the counter or prescription medications. If there is a problem please reply  once you have received a response from your provider. Your safety is important to [place].  If you have drug allergies check your prescription carefully.    You can use MyChart to ask questions about today's visit, request a non-urgent call back, or ask for a work or school excuse for 24 hours related to this e-Visit. If it has been greater than 24 hours you will need to follow up with your provider, or enter a new e-Visit to  address those concerns. You will get an e-mail in the next two days asking about your experience.  I hope that your e-visit has been valuable and will speed your recovery. Thank you for using e-visits.

## 2018-08-13 MED FILL — buPROPion HCL ER (XL) 150 M: 150 | 30 days supply | Qty: 30 | Fill #0

## 2018-08-23 MED FILL — FLUoxetine HCL 10 MG TABS: 10 | 90 days supply | Qty: 90 | Fill #0

## 2018-10-17 ENCOUNTER — Telehealth: Payer: 59 | Admitting: Family

## 2018-10-17 ENCOUNTER — Ambulatory Visit: Payer: Self-pay | Admitting: Nurse Practitioner

## 2018-10-17 VITALS — BP 110/70 | HR 75 | Temp 98.3°F | Resp 16 | Wt 132.8 lb

## 2018-10-17 DIAGNOSIS — J329 Chronic sinusitis, unspecified: Secondary | ICD-10-CM

## 2018-10-17 DIAGNOSIS — B9789 Other viral agents as the cause of diseases classified elsewhere: Secondary | ICD-10-CM

## 2018-10-17 DIAGNOSIS — J069 Acute upper respiratory infection, unspecified: Secondary | ICD-10-CM

## 2018-10-17 MED ORDER — PSEUDOEPH-BROMPHEN-DM 30-2-10 MG/5ML PO SYRP: 5.0000 mL | ORAL_SOLUTION | Freq: Four times a day (QID) | ORAL | 0 refills | Status: AC | PRN

## 2018-10-17 MED ORDER — MONTELUKAST SODIUM 10 MG PO TABS: 10.0000 mg | ORAL_TABLET | Freq: Every day | ORAL | 0 refills | Status: DC

## 2018-10-17 MED ORDER — FLUTICASONE PROPIONATE 50 MCG/ACT NA SUSP: 2.0000 | Freq: Every day | NASAL | 6 refills | Status: DC

## 2018-10-17 MED ORDER — FLUTICASONE PROPIONATE 50 MCG/ACT NA SUSP: 2.0000 | Freq: Every day | NASAL | 0 refills | Status: DC

## 2018-10-17 MED FILL — FLUTICASONE PROP 50 MCG SPR: 50 | 30 days supply | Qty: 16 | Fill #0

## 2018-10-17 NOTE — Progress Notes (Signed)
We are sorry you are not feeling well.  Here is how we plan to help!  Based on what you have shared with me, it looks like you may have a viral upper respiratory infection or a "common cold".  Colds are caused by a large number of viruses; however, rhinovirus is the most common cause.   Symptoms of the common cold vary from person to person, with common symptoms including sore throat, cough, and malaise.  A low-grade fever of 100.4 may present, but is often uncommon.  Symptoms vary however, and are closely related to a person's age or underlying illnesses.  The most common symptoms associated with the common cold are nasal discharge or congestion, cough, sneezing, headache and pressure in the ears and face.  Cold symptoms usually persist for about 3 to 10 days, but can last up to 2 weeks.  It is important to know that colds do not cause serious illness or complications in most cases.    The common cold is transmitted from person to person, with the most common method of transmission being a person's hands.  The virus is able to live on the skin and can infect other persons for up to 2 hours after direct contact.  Also, colds are transmitted when someone coughs or sneezes; thus, it is important to cover the mouth to reduce this risk.  To keep the spread of the common cold at [place], good hand hygiene is very important.  This is an infection that is most likely caused by a virus. There are no specific treatments for the common cold other than to help you with the symptoms until the infection runs its course.    For nasal congestion, you may use an oral decongestants such as Mucinex D or if you have glaucoma or high blood pressure use plain Mucinex.  Saline nasal spray or nasal drops can help and can safely be used as often as needed for congestion.  For your congestion, I have prescribed Fluticasone nasal spray one spray in each nostril twice a day  If you do not have a history of heart disease, hypertension,  diabetes or thyroid disease, prostate/bladder issues or glaucoma, you may also use Sudafed to treat nasal congestion.  It is highly recommended that you consult with a pharmacist or your primary care physician to ensure this medication is safe for you to take.      If you have a sore or scratchy throat, use a saltwater gargle-  to  teaspoon of salt dissolved in a 4-ounce to 8-ounce glass of warm water.  Gargle the solution for approximately 15-30 seconds and then spit.  It is important not to swallow the solution.  You can also use throat lozenges/cough drops and Chloraseptic spray to help with throat pain or discomfort.  Warm or cold liquids can also be helpful in relieving throat pain.  For headache, pain or general discomfort, you can use Ibuprofen or Tylenol as directed.   Some authorities believe that zinc sprays or the use of Echinacea may shorten the course of your symptoms.   HOME CARE . Only take medications as instructed by your medical team. . Be sure to drink plenty of fluids. Water is fine as well as fruit juices, sodas and electrolyte beverages. You may want to stay away from caffeine or alcohol. If you are nauseated, try taking small sips of liquids. How do you know if you are getting enough fluid? Your urine should be a pale yellow or  almost colorless. . Get rest. . Taking a steamy shower or using a humidifier may help nasal congestion and ease sore throat pain. You can place a towel over your head and breathe in the steam from hot water coming from a faucet. . Using a saline nasal spray works much the same way. . Cough drops, hard candies and sore throat lozenges may ease your cough. . Avoid close contacts especially the very young and the elderly . Cover your mouth if you cough or sneeze . Always remember to wash your hands.   GET HELP RIGHT AWAY IF: . You develop worsening fever. . If your symptoms do not improve within 10 days . You develop yellow or green discharge from  your nose over 3 days. . You have coughing fits . You develop a severe head ache or visual changes. . You develop shortness of breath, difficulty breathing or start having chest pain . Your symptoms persist after you have completed your treatment plan  MAKE SURE YOU   Understand these instructions.  Will watch your condition.  Will get help right away if you are not doing well or get worse.  Your e-visit answers were reviewed by a board certified advanced clinical practitioner to complete your personal care plan. Depending upon the condition, your plan could have included both over the counter or prescription medications. Please review your pharmacy choice. If there is a problem, you may call our nursing hot line at and have the prescription routed to another pharmacy. Your safety is important to [place]. If you have drug allergies check your prescription carefully.   You can use MyChart to ask questions about today's visit, request a non-urgent call back, or ask for a work or school excuse for 24 hours related to this e-Visit. If it has been greater than 24 hours you will need to follow up with your provider, or enter a new e-Visit to address those concerns. You will get an e-mail in the next two days asking about your experience.  I hope that your e-visit has been valuable and will speed your recovery. Thank you for using e-visits.

## 2018-10-17 NOTE — Patient Instructions (Signed)
Sinusitis, Adult Take medication as prescribed. -Ibuprofen or Tylenol for pain, fever, or general discomfort. -Increase fluids. -Sleep elevated on at least 2 pillows at bedtime to help with cough. -Use a humidifier or vaporizer when at home and during sleep to help with cough. -Purchase normal saline nasal spray to help with nasal congestion. -May use a teaspoon of honey or over-the-counter cough drops to help with cough. -Follow-up if symptoms do not improve.   Sinusitis is inflammation of your sinuses. Sinuses are hollow spaces in the bones around your face. Your sinuses are located:  Around your eyes.  In the middle of your forehead.  Behind your nose.  In your cheekbones. Mucus normally drains out of your sinuses. When your nasal tissues become inflamed or swollen, mucus can become trapped or blocked. This allows bacteria, viruses, and fungi to grow, which leads to infection. Most infections of the sinuses are caused by a virus. Sinusitis can develop quickly. It can last for up to 4 weeks (acute) or for more than 12 weeks (chronic). Sinusitis often develops after a cold. What are the causes? This condition is caused by anything that creates swelling in the sinuses or stops mucus from draining. This includes:  Allergies.  Asthma.  Infection from bacteria or viruses.  Deformities or blockages in your nose or sinuses.  Abnormal growths in the nose (nasal polyps).  Pollutants, such as chemicals or irritants in the air.  Infection from fungi (rare). What increases the risk? You are more likely to develop this condition if you:  Have a weak body defense system (immune system).  Do a lot of swimming or diving.  Overuse nasal sprays.  Smoke. What are the signs or symptoms? The main symptoms of this condition are pain and a feeling of pressure around the affected sinuses. Other symptoms include:  Stuffy nose or congestion.  Thick drainage from your nose.  Swelling  and warmth over the affected sinuses.  Headache.  Upper toothache.  A cough that may get worse at night.  Extra mucus that collects in the throat or the back of the nose (postnasal drip).  Decreased sense of smell and taste.  Fatigue.  A fever.  Sore throat.  Bad breath. How is this diagnosed? This condition is diagnosed based on:  Your symptoms.  Your medical history.  A physical exam.  Tests to find out if your condition is acute or chronic. This may include: ? Checking your nose for nasal polyps. ? Viewing your sinuses using a device that has a light (endoscope). ? Testing for allergies or bacteria. ? Imaging tests, such as an MRI or CT scan. In rare cases, a bone biopsy may be done to rule out more serious types of fungal sinus disease. How is this treated? Treatment for sinusitis depends on the cause and whether your condition is chronic or acute.  If caused by a virus, your symptoms should go away on their own within 10 days. You may be given medicines to relieve symptoms. They include: ? Medicines that shrink swollen nasal passages (topical intranasal decongestants). ? Medicines that treat allergies (antihistamines). ? A spray that eases inflammation of the nostrils (topical intranasal corticosteroids). ? Rinses that help get rid of thick mucus in your nose (nasal saline washes).  If caused by bacteria, your health care provider may recommend waiting to see if your symptoms improve. Most bacterial infections will get better without antibiotic medicine. You may be given antibiotics if you have: ? A severe infection. ?  A weak immune system.  If caused by narrow nasal passages or nasal polyps, you may need to have surgery. Follow these instructions at home: Medicines  Take, use, or apply over-the-counter and prescription medicines only as told by your health care provider. These may include nasal sprays.  If you were prescribed an antibiotic medicine, take it  as told by your health care provider. Do not stop taking the antibiotic even if you start to feel better. Hydrate and humidify   Drink enough fluid to keep your urine pale yellow. Staying hydrated will help to thin your mucus.  Use a cool mist humidifier to keep the humidity level in your home above 50%.  Inhale steam for 10-15 minutes, 3-4 times a day, or as told by your health care provider. You can do this in the bathroom while a hot shower is running.  Limit your exposure to cool or dry air. Rest  Rest as much as possible.  Sleep with your head raised (elevated).  Make sure you get enough sleep each night. General instructions   Apply a warm, moist washcloth to your face 3-4 times a day or as told by your health care provider. This will help with discomfort.  Wash your hands often with soap and water to reduce your exposure to germs. If soap and water are not available, use hand sanitizer.  Do not smoke. Avoid being around people who are smoking (secondhand smoke).  Keep all follow-up visits as told by your health care provider. This is important. Contact a health care provider if:  You have a fever.  Your symptoms get worse.  Your symptoms do not improve within 10 days. Get help right away if:  You have a severe headache.  You have persistent vomiting.  You have severe pain or swelling around your face or eyes.  You have vision problems.  You develop confusion.  Your neck is stiff.  You have trouble breathing. Summary  Sinusitis is soreness and inflammation of your sinuses. Sinuses are hollow spaces in the bones around your face.  This condition is caused by nasal tissues that become inflamed or swollen. The swelling traps or blocks the flow of mucus. This allows bacteria, viruses, and fungi to grow, which leads to infection.  If you were prescribed an antibiotic medicine, take it as told by your health care provider. Do not stop taking the antibiotic even  if you start to feel better.  Keep all follow-up visits as told by your health care provider. This is important. This information is not intended to replace advice given to you by your health care provider. Make sure you discuss any questions you have with your health care provider. Document Released: 09/05/2005 Document Revised: 02/05/2018 Document Reviewed: 02/05/2018 [employer] Interactive Patient Education  2019 [employer].

## 2018-10-17 NOTE — Progress Notes (Signed)
Subjective:  Vanessa [name] is a 43 y.o. female who presents for evaluation of URI like symptoms.  Symptoms include bilateral ear pressure/pain, headache described as throbbing, nasal congestion, post nasal drip, productive cough with yellow colored sputum, sinus pressure and sore throat and decreased appetite  Onset of symptoms was 4 days ago, and has been unchanged since that time.  The patient denies fever, chills, wheezing, shortness of breath, difficulty swallowing, or difficulty breathing.  The patient states I feel like I have knives in my throat.  Patient also states that she feels like someone has been pushing cotton down her throat.  Patient rates current pain 8/10 at present.  Treatment to date:  Dayquil and Nyquil.  High risk factors for influenza complications:  none.  The patient informs that she completed an e-visit today and was told that she had a cold.  Patient states that she was sure that she has strep throat.  The following portions of the patient's history were reviewed and updated as appropriate:  allergies, current medications and past medical history.  Past Medical History:  Diagnosis Date  . Abnormal Pap smear   . AMA (advanced maternal age) multigravida 24+   . Gestational diabetes   . Hx of varicella     Constitutional: positive for fatigue, negative for anorexia, chills, fevers and malaise Eyes: negative Ears, nose, mouth, throat, and face: positive for nasal congestion and sore throat, negative for ear drainage, earaches and hoarseness Respiratory: positive for cough and sputum, negative for asthma, chronic bronchitis, pneumonia, stridor and wheezing Cardiovascular: negative Gastrointestinal: positive for decreased appetite, negative for abdominal pain, diarrhea, nausea and vomiting Neurological: positive for headaches, negative for coordination problems, dizziness, paresthesia, vertigo and weakness Allergic/Immunologic: positive for hay fever   Objective:  BP  110/70 (BP Location: Right Arm, Patient Position: Sitting, Cuff Size: Normal)   Pulse 75   Temp 98.3 F (36.8 C) (Oral)   Resp 16   Wt 132 lb 12.8 oz (60.2 kg)   SpO2 98%   BMI 21.43 kg/m  Physical Exam Vitals signs reviewed.  Constitutional:      General: She is not in acute distress.    Appearance: She is well-developed.  HENT:     Head: Normocephalic.     Comments: +bilateral maxillary sinus tendernss, no frontal sinus tenderness    Right Ear: Tympanic membrane and ear canal normal.     Left Ear: Tympanic membrane and ear canal normal.     Nose: Congestion present. No rhinorrhea.     Mouth/Throat:     Mouth: Mucous membranes are moist. No oral lesions.     Pharynx: Uvula midline. Posterior oropharyngeal erythema present. No oropharyngeal exudate or uvula swelling.     Tonsils: No tonsillar exudate. Swelling: 0 on the right. 0 on the left.  Neck:     Musculoskeletal: Normal range of motion and neck supple.  Cardiovascular:     Rate and Rhythm: Normal rate and regular rhythm.     Heart sounds: Normal heart sounds.  Pulmonary:     Effort: Pulmonary effort is normal. No respiratory distress.     Breath sounds: No wheezing or rales.  Abdominal:     General: There is no distension.     Palpations: Abdomen is soft.     Tenderness: There is no abdominal tenderness.  Lymphadenopathy:     Cervical: No cervical adenopathy.  Skin:    General: Skin is warm and dry.     Capillary Refill: Capillary  refill takes less than 2 seconds.  Neurological:     General: No focal deficit present.     Mental Status: She is alert and oriented to person, place, and time.  Psychiatric:        Mood and Affect: Mood normal.        Behavior: Behavior normal.   Strep Test: Negative   Assessment:  Viral Sinusitis    Plan:   Exam findings, diagnosis etiology and medication use and indications reviewed with patient. Follow- Up and discharge instructions provided. No emergent/urgent issues found  on exam.  Some the patient's symptoms, clinical presentation, and duration of symptoms, patient's findings are congruent with that of viral etiology.  Patient was disappointed to hear that I would not be prescribing an antibiotic for her.  Patient suggested changing her symptoms, along with changing the duration of her symptoms in order to gain an antibiotic.  Discussed with patient that this is not an option and that prescribing unnecessary antibiotics would not be helpful for her.  Informed patient that we will perform symptomatic treatment to include Bromfed, Flonase, and Singulair.  I feel patient's symptoms are consistent with that of a viral sinusitis as she did have maxillary sinus tenderness, headache, and nasal congestion.  Patient did not exhibit any symptoms of a bacterial infection to include fever, purulent nasal drainage, double sickening or purulent sputum.  Informed patient to call within 48 hours if there is any change in her condition.  Patient education was provided. Patient verbalized understanding of information provided and agrees with plan of care (POC), all questions answered. The patient is advised to call or return to clinic if condition does not see an improvement in symptoms, or to seek the care of the closest emergency department if condition worsens with the above plan.   1. Viral sinusitis  - brompheniramine-pseudoephedrine-DM 30-2-10 MG/5ML syrup; Take 5 mLs by mouth 4 (four) times daily as needed for up to 7 days.  Dispense: 150 mL; Refill: 0 - montelukast (SINGULAIR) 10 MG tablet; Take 1 tablet (10 mg total) by mouth at bedtime for 30 days.  Dispense: 30 tablet; Refill: 0 - fluticasone (FLONASE) 50 MCG/ACT nasal spray; Place 2 sprays into both nostrils daily for 10 days.  Dispense: 16 g; Refill: 0 Take medication as prescribed. -Ibuprofen or Tylenol for pain, fever, or general discomfort. -Increase fluids. -Sleep elevated on at least 2 pillows at bedtime to help with  cough. -Use a humidifier or vaporizer when at home and during sleep to help with cough. -Purchase normal saline nasal spray to help with nasal congestion. -May use a teaspoon of honey or over-the-counter cough drops to help with cough. -Follow-up if symptoms do not improve.

## 2018-10-17 NOTE — Progress Notes (Signed)
As stated in your prior a visit, I believe you have a viral infection. Please continue to use OTC medication as needed. Providers prescribe antibiotics to treat infections caused by bacteria. Antibiotics are very powerful in treating bacterial infections when they are used properly. To maintain their effectiveness, they should be used only when necessary. Overuse of antibiotics has resulted in the development of superbugs that are resistant to treatment!    After careful review of your answers, I would not recommend an antibiotic for your condition.  Antibiotics are not effective against viruses and therefore should not be used to treat them. Common examples of infections caused by viruses include colds and flu.

## 2018-10-23 ENCOUNTER — Telehealth: Payer: 59 | Admitting: Physician Assistant

## 2018-10-23 DIAGNOSIS — R059 Cough, unspecified: Secondary | ICD-10-CM

## 2018-10-23 DIAGNOSIS — R079 Chest pain, unspecified: Secondary | ICD-10-CM

## 2018-10-23 DIAGNOSIS — R05 Cough: Secondary | ICD-10-CM

## 2018-10-23 NOTE — Progress Notes (Signed)
Based on what you shared with me it looks like you have a serious condition that should be evaluated in a face to face office visit.  NOTE: If you entered your credit card information for this eVisit, you will not be charged. You may see a "hold" on your card for the $30 but that hold will drop off and you will not have a charge processed.  If you are having a true medical emergency please call 911.  If you need an urgent face to face visit, [facility] has four urgent care centers for your convenience.  If you need care fast and have a high deductible or no insurance consider: ?  [email or web address] to reserve your spot online an avoid wait times  [facility] [address] [place], [place] [address] 8 am to 8 pm Monday-Friday 10 am to 4 pm Saturday-Sunday *Across the street from [employer]  [place], [address] 8 am to 5 pm Monday-Friday * In the [facility] on the [facility]   The following sites will take your insurance:  [facility] a Provider at this Location  [place], [place] [address] 10 am to 8 pm Monday-Friday 12 pm to 8 pm [facility] a Provider at this Location  [place], [place], [place] [address] 8 am to 8 pm Monday-Friday 9 am to 6 pm Saturday 11 am to 6 pm Sunday   [facility]  [phone] Get Driving Directions  [address] [place], [place] [address] 8 am to 8 pm Monday-Friday 8 am to 4 pm Saturday-Sunday   Your e-visit answers were reviewed by a board certified advanced clinical practitioner to complete your personal care plan.  Thank you for using e-Visits.  ===View-only below this line===   ----- Message -----    From:  Vanessa [name]    Sent: 10/23/2018  8:52 AM EST      To: E-Visit Mailing List Subject: E-Visit Submission: Cough  E-Visit Submission: Cough --------------------------------  Question: How long have you been coughing? Answer:   7 or more days  Question: How would you describe the cough? Answer:   A deep cough  Question: How often are you coughing? Answer:   Constantly  Question: Does the cough prevent you from sleeping at night? Answer:   Yes  Question: What other symptoms have you experienced with the cough? Answer:   Runny nose            Sore throat            Swollen glands            Chest pain            Headache            Wheezing  Question: Do you have a fever? Answer:   I do not know  Question: Describe your sore throat: Answer:   congested, constant.  Question: How long have you had a sore throat? Answer:   7 or more days  Question: Do you have any tenderness or swelling in your neck? Answer:   Yes  Question: Are you coughing up any mucus? Answer:   I am coughing up a little bit of mucus  Question: Do you use a  maintenance inhaler? Answer:   No  Question: Do you use a rescue inhaler (such as Ventolin?) Answer:   No  Question: Have you previously required a prescription for prednisone for cough? Answer:   No  Question: Are you diabetic? Answer:   No  Question: Are you pregnant? Answer:   I am confident that I am not pregnant  Question: Are you breastfeeding? Answer:   No  Question: What is the appearance of the mucus? Answer:   The mucus has changed from clear to colored  Question: Do you have any of the following? Answer:   Unexpected weight loss            Sweating at night            Loss of appetite            Fatigue  Question: Do you smoke? Answer:   No  Question: Have you ever smoked? Answer:   I have never smoked  Question: Are there people you know with similar symptoms? Answer:   Yes  Question: Are you experiencing any of  the following? Answer:   Coughing more after eating            Coughing more when lying            Coughing more after exercise  Question: Are you having difficulty breathing? Answer:   No  Question: Is your coughing worse when you are exposed to pollen, dust, or other things in the environment? Answer:   Yes  Question: Have you been treated for a similar cough in the past? Answer:   No  Question: Have you ever been diagnosed with asthma, bronchitis, or lung disease? Answer:   No  Question: Have you recently started on any medications for your heart or for blood pressure? Answer:   No  Question: Have you recently been hospitalized? Answer:   No  Question: Please list your medication allergies that you may have ? (If 'none' , please list as 'none') Answer:   allergy to pcn/sulfa  Question: Please list any additional comments  Answer:

## 2018-10-29 MED FILL — NABUMETONE 500 MG TABLET: 500 | 30 days supply | Qty: 60 | Fill #0

## 2018-10-29 MED FILL — SETLAKIN 0.15 MG-0.03 MG TA: 0.15-0.03 | 91 days supply | Qty: 91 | Fill #4

## 2018-11-05 DIAGNOSIS — J310 Chronic rhinitis: Secondary | ICD-10-CM | POA: Diagnosis not present

## 2018-11-05 DIAGNOSIS — J209 Acute bronchitis, unspecified: Secondary | ICD-10-CM | POA: Diagnosis not present

## 2018-11-05 MED FILL — levoFLOXacin 500 MG TABS: 500 | 10 days supply | Qty: 10 | Fill #0

## 2018-11-05 MED FILL — VENTOLIN HFA 90 MCG INHALER: 108 (90 BAS | 30 days supply | Qty: 18 | Fill #0

## 2018-11-05 MED FILL — BENZONATATE 200 MG CAP: 200 | 10 days supply | Qty: 30 | Fill #0

## 2018-12-12 MED FILL — NABUMETONE 500 MG TABS: 500 | 30 days supply | Qty: 60 | Fill #1

## 2018-12-17 MED FILL — FLUoxetine HCL 10 MG TABS: 10 | 90 days supply | Qty: 90 | Fill #0

## 2019-01-10 DIAGNOSIS — L705 Acne excoriee des jeunes filles: Secondary | ICD-10-CM | POA: Diagnosis not present

## 2019-01-14 MED FILL — NABUMETONE 500 MG TABS: 500 | 30 days supply | Qty: 60 | Fill #2

## 2019-01-14 MED FILL — SETLAKIN 0.15 MG-0.03 MG TA: 0.15-0.03 | 91 days supply | Qty: 91 | Fill #0

## 2019-02-04 ENCOUNTER — Other Ambulatory Visit: Payer: Self-pay | Admitting: Certified Nurse Midwife

## 2019-02-04 DIAGNOSIS — Z1231 Encounter for screening mammogram for malignant neoplasm of breast: Secondary | ICD-10-CM

## 2019-02-19 ENCOUNTER — Telehealth: Payer: 59 | Admitting: Physician Assistant

## 2019-02-19 DIAGNOSIS — L255 Unspecified contact dermatitis due to plants, except food: Secondary | ICD-10-CM | POA: Diagnosis not present

## 2019-02-19 MED ORDER — PREDNISONE 20 MG PO TABS: 40.0000 mg | ORAL_TABLET | Freq: Every day | ORAL | 0 refills | Status: DC

## 2019-02-19 NOTE — Progress Notes (Signed)
E Visit for Rash  We are sorry that you are not feeling well. Here is how we plan to help!  Based on what you shared with me it looks like you have contact dermatitis.  Contact dermatitis is a skin rash caused by something that touches the skin and causes irritation or inflammation.  Your skin may be red, swollen, dry, cracked, and itch.  The rash should go away in a few days but can last a few weeks.  If you get a rash, it's important to figure out what caused it so the irritant can be avoided in the future. I am prescribing a 5 day course of prednisone given that you rash has already been present for two weeks.       HOME CARE:  Take cool showers and avoid direct sunlight. Apply cool compress or wet dressings. Take a bath in an oatmeal bath.  Sprinkle content of one Aveeno packet under running faucet with comfortably warm water.  Bathe for 15-20 minutes, 1-2 times daily.  Pat dry with a towel. Do not rub the rash. Use hydrocortisone cream. Take an antihistamine like Benadryl for widespread rashes that itch.  The adult dose of Benadryl is 25-50 mg by mouth 4 times daily. Caution:  This type of medication may cause sleepiness.  Do not drink alcohol, drive, or operate dangerous machinery while taking antihistamines.  Do not take these medications if you have prostate enlargement.  Read package instructions thoroughly on all medications that you take.  GET HELP RIGHT AWAY IF:  Symptoms don't go away after treatment. Severe itching that persists. If you rash spreads or swells. If you rash begins to smell. If it blisters and opens or develops a yellow-brown crust. You develop a fever. You have a sore throat. You become short of breath.  MAKE SURE YOU:  Understand these instructions. Will watch your condition. Will get help right away if you are not doing well or get worse.  Thank you for choosing an e-visit. Your e-visit answers were reviewed by a board certified advanced clinical  practitioner to complete your personal care plan. Depending upon the condition, your plan could have included both over the counter or prescription medications. Please review your pharmacy choice. Be sure that the pharmacy you have chosen is open so that you can pick up your prescription now.  If there is a problem you may message your provider in [place] to have the prescription routed to another pharmacy. Your safety is important to [place]. If you have drug allergies check your prescription carefully.  For the next 24 hours, you can use MyChart to ask questions about today's visit, request a non-urgent call back, or ask for a work or school excuse from your e-visit provider. You will get an email in the next two days asking about your experience. I hope that your e-visit has been valuable and will speed your recovery.      ===View-only below this line===   ----- Message -----    From: Vanessa [name]    Sent: 02/19/2019 11:25 AM EDT      To: E-Visit Mailing List Subject: E-Visit Submission: Poison Ivy  E-Visit Submission: Poison Ivy --------------------------------  Question: Which best describes the location of your rash? Answer:   On multiple sites  Question: What best describes your rash? Answer:   A large area of red lines, streaks, and blisters that itch  Question: Were you recently outside? Answer:   Yes  Question: If YES, Were  you possibly exposed to poison ivy, oak or sumac leaves? Answer:   Yes  Question: If NO, Do you have a pet that was out of doors? Answer:     Question: Has anyone in your family been out of doors and you have handled their clothes, shoes, equipment or towels? Answer:   Yes  Question: Have you been outside near anyone burning brush (yard debris) Answer:   Yes  Question: If YES, Are you abnormally wheezing or short of breath since that exposure? Answer:   No  Question: How long was it between the time you were exposed and the apppearance of the  rash? Answer:   Less than a week  Question: How long has your rash been present? Answer:   Greater than two weeks  Question: Have you used any over the counter medications for your rash? Answer:   Yes  Question: Which of the following OTC Medications have you used? [place] all that apply) Answer:   Calamine Lotion            Hydrocortisone ointment            Benadryl gel            An oral antihistamine such as Benadryl  Question: Have you developed a fever? Answer:   No  Question: Is the rash spreading? Answer:   Yes  Question: Have you scratched the rash and broken the skin or blisters? Answer:   Yes  Question: If YES, Has pus (milky purulent drainage) developed from or around the blisters? Answer:   No  Question: Can you tolerate oral prednisone? Answer:   Yes  Question: Do you have any other comments or concerns for your provider? Answer:     Question: Are you able to attach a photograph of the rash? Answer:   No  Question: Please list your medication allergies that you may have ? (If 'none' , please list as 'none') Answer:   pcn and sulfa = hives  Question: Please list any additional comments  Answer:     Question: Are you pregnant? Answer:   I am confident that I am not pregnant  Question: Are you breastfeeding? Answer:   No  A total of 5-10 minutes was spent evaluating this patients questionnaire and formulating a plan of care.

## 2019-02-26 DIAGNOSIS — Z6821 Body mass index (BMI) 21.0-21.9, adult: Secondary | ICD-10-CM | POA: Diagnosis not present

## 2019-02-26 DIAGNOSIS — Z130 Encounter for screening for diseases of the blood and blood-forming organs and certain disorders involving the immune mechanism: Secondary | ICD-10-CM | POA: Diagnosis not present

## 2019-02-26 DIAGNOSIS — Z1329 Encounter for screening for other suspected endocrine disorder: Secondary | ICD-10-CM | POA: Diagnosis not present

## 2019-02-26 DIAGNOSIS — Z01419 Encounter for gynecological examination (general) (routine) without abnormal findings: Secondary | ICD-10-CM | POA: Diagnosis not present

## 2019-03-01 ENCOUNTER — Telehealth: Payer: 59 | Admitting: Family

## 2019-03-01 DIAGNOSIS — L255 Unspecified contact dermatitis due to plants, except food: Secondary | ICD-10-CM

## 2019-03-01 MED ORDER — PREDNISONE 10 MG (21) PO TBPK: ORAL_TABLET | ORAL | 0 refills | Status: DC

## 2019-03-01 MED FILL — predniSONE 10 MG TABS: 10 | 7 days supply | Qty: 21 | Fill #0

## 2019-03-01 NOTE — Progress Notes (Signed)
Greater than 5 minutes, yet less than 10 minutes of time have been spent researching, coordinating, and implementing care for this patient today.  Thank you for the details you included in the comment boxes. Those details are very helpful in determining the best course of treatment for you and help [place] to provide the best care.  Let's try prednisone 1 more time. If you notice any other symptoms such as shortness of breath or trouble breathing, please be seen face to face. Sometimes, this takes a second course with a  Stronger taper.   ----------------------------------------  We are sorry that you are not feeing well.  Here is how we plan to help!  Based on what you have shared with me it looks like you have had an allergic reaction to the oily resin from a group of plants.  This resin is very sticky, so it easily attaches to your skin, clothing, tools equipment, and pet's fur.    This blistering rash is often called poison ivy rash although it can come from contact with the leaves, stems and roots of poison ivy, poison oak and poison sumac.  The oily resin contains urushiol (u-ROO-she-ol) that produces a skin rash on exposed skin.  The severity of the rash depends on the amount of urushiol that gets on your skin.  A section of skin with more urushiol on it may develop a rash sooner.  The rash usually develops 12-48 hours after exposure and can last two to three weeks.  Your skin must come in direct contact with the plant's oil to be affected.  Blister fluid doesn't spread the rash.  However, if you come into contact with a piece of clothing or pet fur that has urushiol on it, the rash may spread out.  You can also transfer the oil to other parts of your body with your fingers.  Often the rash looks like a straight line because of the way the plant brushes against your skin.    I have developed the following plan to treat your condition Since your rash is widespread or has resulted in a large  number of blisters, I have prescribed an oral corticosteroid.  Please follow these recommendations:  I have sent a prednisone dose pack to your chosen pharmacy. Be sure to follow the instructions carefully and complete the entire prescription. You may use Benadryl or Caladryl topical lotions to sooth the itch and remember cool, not hot, showers and baths can help relieve the itching!  Place cool, wet compresses on the affected area for 15-30 minutes several times a day.  You may also take oral antihistamines, such as diphenhydramine (Benadryl, others), which may also help you sleep better.  Watch your skin for any purulent (pus) drainage or red streaking from the site.  If this occurs, contact your provider.  You may require an antibiotic for a skin infection.  Make sure that the clothes you were wearing as well as any towels or sheets that may have come in contact with the oil (urushiol) are washed in detergent and hot water.         What can you do to prevent this rash?  Avoid the plants.  Learn how to identify poison ivy, poison oak and poison sumac in all seasons.  When hiking or engaging in other activities that might expose you to these plants, try to stay on cleared pathways.  If camping, make sure you pitch your tent in an area free of these plants.  Keep pets from running through wooded areas so that urushiol doesn't accidentally stick to their fur, which you may touch.  Remove or kill the plants.  In your yard, you can get rid of poison ivy by applying an herbicide or pulling it out of the ground, including the roots, while wearing heavy gloves.  Afterward remove the gloves and thoroughly wash them and your hands.  Don't burn poison ivy or related plants because the urushiol can be carried by smoke.  Wear protective clothing.  If needed, protect your skin by wearing socks, boots, pants, long sleeves and vinyl gloves.  Wash your skin right away.  Washing off the oil with soap and water within 30  minutes of exposure may reduce your chances of getting a poison ivy rash.  Even washing after an hour or so can help reduce the severity of the rash.  If you walk through some poison ivy and then later touch your shoes, you may get some urushiol on your hands, which may then transfer to your face or body by touching or rubbing.  If the contaminated object isn't cleaned, the urushiol on it can still cause a skin reaction years later.    Be careful not to reuse towels after you have washed your skin.  Also carefully wash clothing in detergent and hot water to remove all traces of the oil.  Handle contaminated clothing carefully so you don't transfer the urushiol to yourself, furniture, rugs or appliances.  Remember that pets can carry the oil on their fur and paws.  If you think your pet may be contaminated with urushiol, put on some long rubber gloves and give your pet a bath.  Finally, be careful not to burn these plants as the smoke can contain traces of the oil.  Inhaling the smoke may result in difficulty breathing. If that occurred you should see a physician as soon as possible.  See your doctor right away if:   The reaction is severe or widespread  You inhaled the smoke from burning poison ivy and are having difficulty breathing  Your skin continues to swell  The rash affects your eyes, mouth or genitals  Blisters are oozing pus  You develop a fever greater than 100 F (37.8 C)  The rash doesn't get better within a few weeks.  If you scratch the poison ivy rash, bacteria under your fingernails may cause the skin to become infected.  See your doctor if pus starts oozing from the blisters.  Treatment generally includes antibiotics.  Poison ivy treatments are usually limited to self-care methods.  And the rash typically goes away on its own in two to three weeks.     If the rash is widespread or results in a large number of blisters, your doctor may prescribe an oral corticosteroid,  such as prednisone.  If a bacterial infection has developed at the rash site, your doctor may give you a prescription for an oral antibiotic.  MAKE SURE YOU   Understand these instructions.  Will watch your condition.  Will get help right away if you are not doing well or get worse.  Thank you for choosing an e-visit. Your e-visit answers were reviewed by a board certified advanced clinical practitioner to complete your personal care plan. Depending upon the condition, your plan could have included both over the counter or prescription medications.  Please review your pharmacy choice. If there is a problem you may use MyChart messaging to have the prescription routed to  another pharmacy.   Your safety is important to [place]. If you have drug allergies check your prescription carefully.  You can use MyChart to ask questions about today's visit, request a non-urgent call back, or ask for a work or school excuse for 24 hours related to this e-Visit. If it has been greater than 24 hours you will need to follow up with your provider, or enter a new e-Visit to address those concerns.   You will get an email in the next two days asking about your experience. I hope that your e-visit has been valuable and will speed your recovery Thank you for choosing an e-visit.

## 2019-03-02 ENCOUNTER — Ambulatory Visit: Payer: 59

## 2019-03-02 MED FILL — FLUoxetine HCL 10 MG CAPS: 10 | 90 days supply | Qty: 90 | Fill #0

## 2019-03-05 DIAGNOSIS — J3089 Other allergic rhinitis: Secondary | ICD-10-CM | POA: Diagnosis not present

## 2019-03-05 DIAGNOSIS — J3081 Allergic rhinitis due to animal (cat) (dog) hair and dander: Secondary | ICD-10-CM | POA: Diagnosis not present

## 2019-03-05 DIAGNOSIS — J301 Allergic rhinitis due to pollen: Secondary | ICD-10-CM | POA: Diagnosis not present

## 2019-03-05 DIAGNOSIS — H1045 Other chronic allergic conjunctivitis: Secondary | ICD-10-CM | POA: Diagnosis not present

## 2019-03-05 MED FILL — MONTELUKAST SOD 10 MG TAB: 10 | 30 days supply | Qty: 30 | Fill #0

## 2019-03-05 MED FILL — AZELASTINE HCL 137 MCG SPRY: 0.1 | 25 days supply | Qty: 30 | Fill #0

## 2019-03-05 MED FILL — TRIAMCINOLONE 0.1% OINTMENT: 0.1 | 30 days supply | Qty: 454 | Fill #0

## 2019-03-05 MED FILL — SYMBICORT 80-4.5 MCG INH: 80-4.5 | 30 days supply | Qty: 10 | Fill #0

## 2019-03-05 MED FILL — HYDROCORTISONE 2.5% CREAM: 2.5 | 30 days supply | Qty: 60 | Fill #0

## 2019-03-05 MED FILL — ALBUTEROL SULFATE HFA 108 (: 108 (90 BAS | 17 days supply | Qty: 18 | Fill #0

## 2019-03-09 ENCOUNTER — Ambulatory Visit: Payer: 59

## 2019-03-12 DIAGNOSIS — J301 Allergic rhinitis due to pollen: Secondary | ICD-10-CM | POA: Diagnosis not present

## 2019-03-12 DIAGNOSIS — J3081 Allergic rhinitis due to animal (cat) (dog) hair and dander: Secondary | ICD-10-CM | POA: Diagnosis not present

## 2019-03-13 DIAGNOSIS — J3089 Other allergic rhinitis: Secondary | ICD-10-CM | POA: Diagnosis not present

## 2019-03-27 DIAGNOSIS — J3081 Allergic rhinitis due to animal (cat) (dog) hair and dander: Secondary | ICD-10-CM | POA: Diagnosis not present

## 2019-03-27 DIAGNOSIS — J3089 Other allergic rhinitis: Secondary | ICD-10-CM | POA: Diagnosis not present

## 2019-03-27 DIAGNOSIS — J301 Allergic rhinitis due to pollen: Secondary | ICD-10-CM | POA: Diagnosis not present

## 2019-03-29 DIAGNOSIS — J3089 Other allergic rhinitis: Secondary | ICD-10-CM | POA: Diagnosis not present

## 2019-03-29 DIAGNOSIS — J301 Allergic rhinitis due to pollen: Secondary | ICD-10-CM | POA: Diagnosis not present

## 2019-03-29 DIAGNOSIS — J3081 Allergic rhinitis due to animal (cat) (dog) hair and dander: Secondary | ICD-10-CM | POA: Diagnosis not present

## 2019-04-02 MED FILL — MONTELUKAST SOD 10 MG TAB: 10 | 30 days supply | Qty: 30 | Fill #1

## 2019-04-02 MED FILL — SETLAKIN 0.15 MG-0.03 MG TA: 0.15-0.03 | 91 days supply | Qty: 91 | Fill #0

## 2019-04-02 MED FILL — NABUMETONE 500 MG TABS: 500 | 30 days supply | Qty: 60 | Fill #3

## 2019-04-05 DIAGNOSIS — M47812 Spondylosis without myelopathy or radiculopathy, cervical region: Secondary | ICD-10-CM | POA: Diagnosis not present

## 2019-04-05 DIAGNOSIS — J3089 Other allergic rhinitis: Secondary | ICD-10-CM | POA: Diagnosis not present

## 2019-04-05 DIAGNOSIS — M542 Cervicalgia: Secondary | ICD-10-CM | POA: Diagnosis not present

## 2019-04-05 DIAGNOSIS — M5030 Other cervical disc degeneration, unspecified cervical region: Secondary | ICD-10-CM | POA: Diagnosis not present

## 2019-04-05 DIAGNOSIS — J301 Allergic rhinitis due to pollen: Secondary | ICD-10-CM | POA: Diagnosis not present

## 2019-04-05 DIAGNOSIS — J3081 Allergic rhinitis due to animal (cat) (dog) hair and dander: Secondary | ICD-10-CM | POA: Diagnosis not present

## 2019-04-09 DIAGNOSIS — J301 Allergic rhinitis due to pollen: Secondary | ICD-10-CM | POA: Diagnosis not present

## 2019-04-09 DIAGNOSIS — J3089 Other allergic rhinitis: Secondary | ICD-10-CM | POA: Diagnosis not present

## 2019-04-09 DIAGNOSIS — J3081 Allergic rhinitis due to animal (cat) (dog) hair and dander: Secondary | ICD-10-CM | POA: Diagnosis not present

## 2019-04-11 DIAGNOSIS — J3089 Other allergic rhinitis: Secondary | ICD-10-CM | POA: Diagnosis not present

## 2019-04-11 DIAGNOSIS — J3081 Allergic rhinitis due to animal (cat) (dog) hair and dander: Secondary | ICD-10-CM | POA: Diagnosis not present

## 2019-04-11 DIAGNOSIS — J301 Allergic rhinitis due to pollen: Secondary | ICD-10-CM | POA: Diagnosis not present

## 2019-04-15 DIAGNOSIS — J301 Allergic rhinitis due to pollen: Secondary | ICD-10-CM | POA: Diagnosis not present

## 2019-04-15 DIAGNOSIS — J3081 Allergic rhinitis due to animal (cat) (dog) hair and dander: Secondary | ICD-10-CM | POA: Diagnosis not present

## 2019-04-15 DIAGNOSIS — J3089 Other allergic rhinitis: Secondary | ICD-10-CM | POA: Diagnosis not present

## 2019-04-17 DIAGNOSIS — J301 Allergic rhinitis due to pollen: Secondary | ICD-10-CM | POA: Diagnosis not present

## 2019-04-17 DIAGNOSIS — J3089 Other allergic rhinitis: Secondary | ICD-10-CM | POA: Diagnosis not present

## 2019-04-17 DIAGNOSIS — J3081 Allergic rhinitis due to animal (cat) (dog) hair and dander: Secondary | ICD-10-CM | POA: Diagnosis not present

## 2019-04-24 DIAGNOSIS — J3081 Allergic rhinitis due to animal (cat) (dog) hair and dander: Secondary | ICD-10-CM | POA: Diagnosis not present

## 2019-04-24 DIAGNOSIS — J301 Allergic rhinitis due to pollen: Secondary | ICD-10-CM | POA: Diagnosis not present

## 2019-04-24 DIAGNOSIS — J3089 Other allergic rhinitis: Secondary | ICD-10-CM | POA: Diagnosis not present

## 2019-05-07 DIAGNOSIS — J3081 Allergic rhinitis due to animal (cat) (dog) hair and dander: Secondary | ICD-10-CM | POA: Diagnosis not present

## 2019-05-07 DIAGNOSIS — J301 Allergic rhinitis due to pollen: Secondary | ICD-10-CM | POA: Diagnosis not present

## 2019-05-07 DIAGNOSIS — J3089 Other allergic rhinitis: Secondary | ICD-10-CM | POA: Diagnosis not present

## 2019-05-13 DIAGNOSIS — J3081 Allergic rhinitis due to animal (cat) (dog) hair and dander: Secondary | ICD-10-CM | POA: Diagnosis not present

## 2019-05-13 DIAGNOSIS — J3089 Other allergic rhinitis: Secondary | ICD-10-CM | POA: Diagnosis not present

## 2019-05-13 DIAGNOSIS — J301 Allergic rhinitis due to pollen: Secondary | ICD-10-CM | POA: Diagnosis not present

## 2019-05-20 DIAGNOSIS — J3089 Other allergic rhinitis: Secondary | ICD-10-CM | POA: Diagnosis not present

## 2019-05-20 DIAGNOSIS — J3081 Allergic rhinitis due to animal (cat) (dog) hair and dander: Secondary | ICD-10-CM | POA: Diagnosis not present

## 2019-05-20 DIAGNOSIS — J301 Allergic rhinitis due to pollen: Secondary | ICD-10-CM | POA: Diagnosis not present

## 2019-05-31 DIAGNOSIS — J3081 Allergic rhinitis due to animal (cat) (dog) hair and dander: Secondary | ICD-10-CM | POA: Diagnosis not present

## 2019-05-31 DIAGNOSIS — J3089 Other allergic rhinitis: Secondary | ICD-10-CM | POA: Diagnosis not present

## 2019-05-31 DIAGNOSIS — J301 Allergic rhinitis due to pollen: Secondary | ICD-10-CM | POA: Diagnosis not present

## 2019-06-03 DIAGNOSIS — R454 Irritability and anger: Secondary | ICD-10-CM | POA: Diagnosis not present

## 2019-06-04 MED FILL — FLUoxetine HCL 20 MG CAPS: 20 | 90 days supply | Qty: 90 | Fill #0

## 2019-06-05 DIAGNOSIS — J301 Allergic rhinitis due to pollen: Secondary | ICD-10-CM | POA: Diagnosis not present

## 2019-06-05 DIAGNOSIS — J3089 Other allergic rhinitis: Secondary | ICD-10-CM | POA: Diagnosis not present

## 2019-06-05 DIAGNOSIS — J3081 Allergic rhinitis due to animal (cat) (dog) hair and dander: Secondary | ICD-10-CM | POA: Diagnosis not present

## 2019-06-14 DIAGNOSIS — M25561 Pain in right knee: Secondary | ICD-10-CM | POA: Diagnosis not present

## 2019-06-17 DIAGNOSIS — J301 Allergic rhinitis due to pollen: Secondary | ICD-10-CM | POA: Diagnosis not present

## 2019-06-17 DIAGNOSIS — J3081 Allergic rhinitis due to animal (cat) (dog) hair and dander: Secondary | ICD-10-CM | POA: Diagnosis not present

## 2019-06-17 DIAGNOSIS — J3089 Other allergic rhinitis: Secondary | ICD-10-CM | POA: Diagnosis not present

## 2019-06-19 DIAGNOSIS — J301 Allergic rhinitis due to pollen: Secondary | ICD-10-CM | POA: Diagnosis not present

## 2019-06-19 DIAGNOSIS — J3081 Allergic rhinitis due to animal (cat) (dog) hair and dander: Secondary | ICD-10-CM | POA: Diagnosis not present

## 2019-06-19 DIAGNOSIS — J3089 Other allergic rhinitis: Secondary | ICD-10-CM | POA: Diagnosis not present

## 2019-06-25 DIAGNOSIS — J301 Allergic rhinitis due to pollen: Secondary | ICD-10-CM | POA: Diagnosis not present

## 2019-06-25 DIAGNOSIS — J3089 Other allergic rhinitis: Secondary | ICD-10-CM | POA: Diagnosis not present

## 2019-06-25 DIAGNOSIS — J3081 Allergic rhinitis due to animal (cat) (dog) hair and dander: Secondary | ICD-10-CM | POA: Diagnosis not present

## 2019-07-08 DIAGNOSIS — J301 Allergic rhinitis due to pollen: Secondary | ICD-10-CM | POA: Diagnosis not present

## 2019-07-08 DIAGNOSIS — J3081 Allergic rhinitis due to animal (cat) (dog) hair and dander: Secondary | ICD-10-CM | POA: Diagnosis not present

## 2019-07-08 DIAGNOSIS — J3089 Other allergic rhinitis: Secondary | ICD-10-CM | POA: Diagnosis not present

## 2019-07-09 DIAGNOSIS — H524 Presbyopia: Secondary | ICD-10-CM | POA: Diagnosis not present

## 2019-07-09 MED FILL — SETLAKIN 0.15 MG-0.03 MG TA: 0.15-0.03 | 91 days supply | Qty: 91 | Fill #1

## 2019-07-09 MED FILL — NABUMETONE 500 MG TABS: 500 | 30 days supply | Qty: 60 | Fill #4

## 2019-07-09 MED FILL — MONTELUKAST SOD 10 MG TAB: 10 | 30 days supply | Qty: 30 | Fill #2

## 2019-07-16 DIAGNOSIS — J301 Allergic rhinitis due to pollen: Secondary | ICD-10-CM | POA: Diagnosis not present

## 2019-07-16 DIAGNOSIS — J3089 Other allergic rhinitis: Secondary | ICD-10-CM | POA: Diagnosis not present

## 2019-07-16 DIAGNOSIS — J3081 Allergic rhinitis due to animal (cat) (dog) hair and dander: Secondary | ICD-10-CM | POA: Diagnosis not present

## 2019-07-22 DIAGNOSIS — J301 Allergic rhinitis due to pollen: Secondary | ICD-10-CM | POA: Diagnosis not present

## 2019-07-22 DIAGNOSIS — J3081 Allergic rhinitis due to animal (cat) (dog) hair and dander: Secondary | ICD-10-CM | POA: Diagnosis not present

## 2019-07-22 DIAGNOSIS — J3089 Other allergic rhinitis: Secondary | ICD-10-CM | POA: Diagnosis not present

## 2019-07-26 DIAGNOSIS — J3081 Allergic rhinitis due to animal (cat) (dog) hair and dander: Secondary | ICD-10-CM | POA: Diagnosis not present

## 2019-07-26 DIAGNOSIS — J301 Allergic rhinitis due to pollen: Secondary | ICD-10-CM | POA: Diagnosis not present

## 2019-07-26 DIAGNOSIS — J3089 Other allergic rhinitis: Secondary | ICD-10-CM | POA: Diagnosis not present

## 2019-08-01 DIAGNOSIS — J3081 Allergic rhinitis due to animal (cat) (dog) hair and dander: Secondary | ICD-10-CM | POA: Diagnosis not present

## 2019-08-01 DIAGNOSIS — J301 Allergic rhinitis due to pollen: Secondary | ICD-10-CM | POA: Diagnosis not present

## 2019-08-01 DIAGNOSIS — J3089 Other allergic rhinitis: Secondary | ICD-10-CM | POA: Diagnosis not present

## 2019-08-09 DIAGNOSIS — J3081 Allergic rhinitis due to animal (cat) (dog) hair and dander: Secondary | ICD-10-CM | POA: Diagnosis not present

## 2019-08-09 DIAGNOSIS — J301 Allergic rhinitis due to pollen: Secondary | ICD-10-CM | POA: Diagnosis not present

## 2019-08-09 DIAGNOSIS — J3089 Other allergic rhinitis: Secondary | ICD-10-CM | POA: Diagnosis not present

## 2019-08-13 DIAGNOSIS — J301 Allergic rhinitis due to pollen: Secondary | ICD-10-CM | POA: Diagnosis not present

## 2019-08-13 DIAGNOSIS — J3089 Other allergic rhinitis: Secondary | ICD-10-CM | POA: Diagnosis not present

## 2019-08-13 DIAGNOSIS — J3081 Allergic rhinitis due to animal (cat) (dog) hair and dander: Secondary | ICD-10-CM | POA: Diagnosis not present

## 2019-08-19 DIAGNOSIS — J3081 Allergic rhinitis due to animal (cat) (dog) hair and dander: Secondary | ICD-10-CM | POA: Diagnosis not present

## 2019-08-19 DIAGNOSIS — J301 Allergic rhinitis due to pollen: Secondary | ICD-10-CM | POA: Diagnosis not present

## 2019-08-19 DIAGNOSIS — J3089 Other allergic rhinitis: Secondary | ICD-10-CM | POA: Diagnosis not present

## 2019-08-28 DIAGNOSIS — J3081 Allergic rhinitis due to animal (cat) (dog) hair and dander: Secondary | ICD-10-CM | POA: Diagnosis not present

## 2019-08-28 DIAGNOSIS — J301 Allergic rhinitis due to pollen: Secondary | ICD-10-CM | POA: Diagnosis not present

## 2019-08-28 DIAGNOSIS — J3089 Other allergic rhinitis: Secondary | ICD-10-CM | POA: Diagnosis not present

## 2019-08-28 MED FILL — MONTELUKAST SOD 10 MG TAB: 10 | 30 days supply | Qty: 30 | Fill #0

## 2019-08-28 MED FILL — NABUMETONE 500 MG TABLET: 500 | 30 days supply | Qty: 60 | Fill #0

## 2019-08-30 DIAGNOSIS — J3081 Allergic rhinitis due to animal (cat) (dog) hair and dander: Secondary | ICD-10-CM | POA: Diagnosis not present

## 2019-08-30 DIAGNOSIS — J3089 Other allergic rhinitis: Secondary | ICD-10-CM | POA: Diagnosis not present

## 2019-08-30 DIAGNOSIS — J301 Allergic rhinitis due to pollen: Secondary | ICD-10-CM | POA: Diagnosis not present

## 2019-09-02 DIAGNOSIS — J301 Allergic rhinitis due to pollen: Secondary | ICD-10-CM | POA: Diagnosis not present

## 2019-09-02 DIAGNOSIS — J3089 Other allergic rhinitis: Secondary | ICD-10-CM | POA: Diagnosis not present

## 2019-09-02 DIAGNOSIS — J3081 Allergic rhinitis due to animal (cat) (dog) hair and dander: Secondary | ICD-10-CM | POA: Diagnosis not present

## 2019-09-17 DIAGNOSIS — J3081 Allergic rhinitis due to animal (cat) (dog) hair and dander: Secondary | ICD-10-CM | POA: Diagnosis not present

## 2019-09-17 DIAGNOSIS — J3089 Other allergic rhinitis: Secondary | ICD-10-CM | POA: Diagnosis not present

## 2019-09-17 DIAGNOSIS — J301 Allergic rhinitis due to pollen: Secondary | ICD-10-CM | POA: Diagnosis not present

## 2019-09-23 MED FILL — NABUMETONE 500 MG TABS: 500 | 30 days supply | Qty: 60 | Fill #0

## 2019-09-23 MED FILL — MONTELUKAST SOD 10 MG TAB: 10 | 30 days supply | Qty: 30 | Fill #1

## 2019-09-23 MED FILL — FLUoxetine HCL 20 MG CAPS: 20 | 90 days supply | Qty: 90 | Fill #0

## 2019-09-24 DIAGNOSIS — J301 Allergic rhinitis due to pollen: Secondary | ICD-10-CM | POA: Diagnosis not present

## 2019-09-24 DIAGNOSIS — J3081 Allergic rhinitis due to animal (cat) (dog) hair and dander: Secondary | ICD-10-CM | POA: Diagnosis not present

## 2019-09-24 DIAGNOSIS — J3089 Other allergic rhinitis: Secondary | ICD-10-CM | POA: Diagnosis not present

## 2019-09-25 MED FILL — SETLAKIN 0.15 MG-0.03 MG TA: 0.15-0.03 | 91 days supply | Qty: 91 | Fill #0

## 2019-10-16 DIAGNOSIS — J3089 Other allergic rhinitis: Secondary | ICD-10-CM | POA: Diagnosis not present

## 2019-10-16 DIAGNOSIS — J3081 Allergic rhinitis due to animal (cat) (dog) hair and dander: Secondary | ICD-10-CM | POA: Diagnosis not present

## 2019-10-16 DIAGNOSIS — J301 Allergic rhinitis due to pollen: Secondary | ICD-10-CM | POA: Diagnosis not present

## 2019-10-17 ENCOUNTER — Other Ambulatory Visit: Payer: Self-pay | Admitting: Hematology & Oncology

## 2019-10-17 MED ORDER — MUPIROCIN CALCIUM 2 % EX CREA: 1.0000 "application " | TOPICAL_CREAM | Freq: Two times a day (BID) | CUTANEOUS | 0 refills | Status: DC

## 2019-10-17 MED FILL — MUPIROCIN 2% OINTMENT: 2 | 14 days supply | Qty: 22 | Fill #0

## 2019-10-19 ENCOUNTER — Telehealth: Payer: 59 | Admitting: Emergency Medicine

## 2019-10-19 DIAGNOSIS — J029 Acute pharyngitis, unspecified: Secondary | ICD-10-CM | POA: Diagnosis not present

## 2019-10-19 DIAGNOSIS — Z20822 Contact with and (suspected) exposure to covid-19: Secondary | ICD-10-CM | POA: Diagnosis not present

## 2019-10-19 MED ORDER — AZITHROMYCIN 250 MG PO TABS
ORAL_TABLET | ORAL | 0 refills | Status: DC
Start: 2019-10-19 — End: 2021-08-02

## 2019-10-19 NOTE — Progress Notes (Signed)
We are sorry that you are not feeling well.  Here is how we plan to help!  Based on what you have shared with me it is likely that you have strep pharyngitis.  Strep pharyngitis is inflammation and infection in the back of the throat.  This is an infection cause by bacteria and is treated with antibiotics.  I have prescribed Azithromycin 250 mg two tablets today and then one daily for 4 additional days. For throat pain, we recommend over the counter oral pain relief medications such as acetaminophen or aspirin, or anti-inflammatory medications such as ibuprofen or naproxen sodium. Topical treatments such as oral throat lozenges or sprays may be used as needed. Strep infections are not as easily transmitted as other respiratory infections, however we still recommend that you avoid close contact with loved ones, especially the very young and elderly.  Remember to wash your hands thoroughly throughout the day as this is the number one way to prevent the spread of infection and wipe down door knobs and counters with disinfectant.   Home Care:  Only take medications as instructed by your medical team.  Complete the entire course of an antibiotic.  Do not take these medications with alcohol.  A steam or ultrasonic humidifier can help congestion.  You can place a towel over your head and breathe in the steam from hot water coming from a faucet.  Avoid close contacts especially the very young and the elderly.  Cover your mouth when you cough or sneeze.  Always remember to wash your hands.  Get Help Right Away If:  You develop worsening fever or sinus pain.  You develop a severe head ache or visual changes.  Your symptoms persist after you have completed your treatment plan.  Make sure you  Understand these instructions.  Will watch your condition.  Will get help right away if you are not doing well or get worse.  Your e-visit answers were reviewed by a board certified advanced clinical  practitioner to complete your personal care plan.  Depending on the condition, your plan could have included both over the counter or prescription medications.  If there is a problem please reply  once you have received a response from your provider.  Your safety is important to [place].  If you have drug allergies check your prescription carefully.    You can use MyChart to ask questions about today's visit, request a non-urgent call back, or ask for a work or school excuse for 24 hours related to this e-Visit. If it has been greater than 24 hours you will need to follow up with your provider, or enter a new e-Visit to address those concerns.  You will get an e-mail in the next two days asking about your experience.  I hope that your e-visit has been valuable and will speed your recovery. Thank you for using e-visits.     Approximately 5 minutes was used in reviewing the patient's chart, questionnaire, prescribing medications, and documentation.

## 2019-10-23 ENCOUNTER — Ambulatory Visit: Payer: 59 | Attending: Internal Medicine

## 2019-10-24 ENCOUNTER — Other Ambulatory Visit (HOSPITAL_BASED_OUTPATIENT_CLINIC_OR_DEPARTMENT_OTHER): Payer: Self-pay | Admitting: Certified Nurse Midwife

## 2019-10-24 DIAGNOSIS — Z1231 Encounter for screening mammogram for malignant neoplasm of breast: Secondary | ICD-10-CM

## 2019-10-25 ENCOUNTER — Other Ambulatory Visit: Payer: Self-pay

## 2019-10-25 ENCOUNTER — Ambulatory Visit (HOSPITAL_BASED_OUTPATIENT_CLINIC_OR_DEPARTMENT_OTHER)
Admission: RE | Admit: 2019-10-25 | Discharge: 2019-10-25 | Disposition: A | Discharge status: Home / Self Care | Payer: 59 | Source: Ambulatory Visit | Attending: Certified Nurse Midwife | Admitting: Certified Nurse Midwife

## 2019-10-25 DIAGNOSIS — Z1231 Encounter for screening mammogram for malignant neoplasm of breast: Secondary | ICD-10-CM | POA: Insufficient documentation

## 2019-11-22 DIAGNOSIS — J301 Allergic rhinitis due to pollen: Secondary | ICD-10-CM | POA: Diagnosis not present

## 2019-11-22 DIAGNOSIS — J3089 Other allergic rhinitis: Secondary | ICD-10-CM | POA: Diagnosis not present

## 2019-11-22 DIAGNOSIS — J3081 Allergic rhinitis due to animal (cat) (dog) hair and dander: Secondary | ICD-10-CM | POA: Diagnosis not present

## 2019-11-22 DIAGNOSIS — H1045 Other chronic allergic conjunctivitis: Secondary | ICD-10-CM | POA: Diagnosis not present

## 2019-11-22 MED FILL — MONTELUKAST SOD 10 MG TAB: 10 | 30 days supply | Qty: 30 | Fill #0

## 2019-11-22 MED FILL — ALBUTEROL SULFATE HFA 108 (: 108 (90 BAS | 17 days supply | Qty: 18 | Fill #0

## 2019-12-26 DIAGNOSIS — J3081 Allergic rhinitis due to animal (cat) (dog) hair and dander: Secondary | ICD-10-CM | POA: Diagnosis not present

## 2019-12-26 DIAGNOSIS — J301 Allergic rhinitis due to pollen: Secondary | ICD-10-CM | POA: Diagnosis not present

## 2019-12-26 DIAGNOSIS — J3089 Other allergic rhinitis: Secondary | ICD-10-CM | POA: Diagnosis not present

## 2019-12-30 DIAGNOSIS — J3089 Other allergic rhinitis: Secondary | ICD-10-CM | POA: Diagnosis not present

## 2019-12-30 DIAGNOSIS — J3081 Allergic rhinitis due to animal (cat) (dog) hair and dander: Secondary | ICD-10-CM | POA: Diagnosis not present

## 2019-12-30 DIAGNOSIS — J301 Allergic rhinitis due to pollen: Secondary | ICD-10-CM | POA: Diagnosis not present

## 2020-01-01 MED FILL — FLUoxetine HCL 20 MG CAPS: 20 | 90 days supply | Qty: 90 | Fill #1

## 2020-01-01 MED FILL — NABUMETONE 500 MG TABS: 500 | 30 days supply | Qty: 60 | Fill #1

## 2020-01-01 MED FILL — MONTELUKAST SOD 10 MG TAB: 10 | 30 days supply | Qty: 30 | Fill #2

## 2020-01-09 DIAGNOSIS — J301 Allergic rhinitis due to pollen: Secondary | ICD-10-CM | POA: Diagnosis not present

## 2020-01-09 DIAGNOSIS — J3089 Other allergic rhinitis: Secondary | ICD-10-CM | POA: Diagnosis not present

## 2020-01-09 DIAGNOSIS — J3081 Allergic rhinitis due to animal (cat) (dog) hair and dander: Secondary | ICD-10-CM | POA: Diagnosis not present

## 2020-01-10 DIAGNOSIS — J3089 Other allergic rhinitis: Secondary | ICD-10-CM | POA: Diagnosis not present

## 2020-01-14 DIAGNOSIS — J301 Allergic rhinitis due to pollen: Secondary | ICD-10-CM | POA: Diagnosis not present

## 2020-01-14 DIAGNOSIS — J3081 Allergic rhinitis due to animal (cat) (dog) hair and dander: Secondary | ICD-10-CM | POA: Diagnosis not present

## 2020-01-14 DIAGNOSIS — J3089 Other allergic rhinitis: Secondary | ICD-10-CM | POA: Diagnosis not present

## 2020-01-24 DIAGNOSIS — J301 Allergic rhinitis due to pollen: Secondary | ICD-10-CM | POA: Diagnosis not present

## 2020-01-24 DIAGNOSIS — J3089 Other allergic rhinitis: Secondary | ICD-10-CM | POA: Diagnosis not present

## 2020-01-24 DIAGNOSIS — J3081 Allergic rhinitis due to animal (cat) (dog) hair and dander: Secondary | ICD-10-CM | POA: Diagnosis not present

## 2020-02-03 ENCOUNTER — Telehealth: Payer: 59 | Admitting: Nurse Practitioner

## 2020-02-03 DIAGNOSIS — L247 Irritant contact dermatitis due to plants, except food: Secondary | ICD-10-CM | POA: Diagnosis not present

## 2020-02-03 MED ORDER — PREDNISONE 10 MG (21) PO TBPK: ORAL_TABLET | ORAL | 0 refills | Status: DC

## 2020-02-03 MED FILL — PREDNISONE 10 MG TABS: 10 | 6 days supply | Qty: 21 | Fill #0

## 2020-02-03 NOTE — Progress Notes (Signed)

## 2020-02-07 DIAGNOSIS — J301 Allergic rhinitis due to pollen: Secondary | ICD-10-CM | POA: Diagnosis not present

## 2020-02-07 DIAGNOSIS — J3081 Allergic rhinitis due to animal (cat) (dog) hair and dander: Secondary | ICD-10-CM | POA: Diagnosis not present

## 2020-02-07 DIAGNOSIS — J3089 Other allergic rhinitis: Secondary | ICD-10-CM | POA: Diagnosis not present

## 2020-02-12 DIAGNOSIS — J3089 Other allergic rhinitis: Secondary | ICD-10-CM | POA: Diagnosis not present

## 2020-02-12 DIAGNOSIS — J301 Allergic rhinitis due to pollen: Secondary | ICD-10-CM | POA: Diagnosis not present

## 2020-02-12 DIAGNOSIS — J3081 Allergic rhinitis due to animal (cat) (dog) hair and dander: Secondary | ICD-10-CM | POA: Diagnosis not present

## 2020-02-13 MED FILL — SETLAKIN 0.15 MG-0.03 MG TA: 0.15-0.03 | 91 days supply | Qty: 91 | Fill #1

## 2020-02-13 MED FILL — NABUMETONE 500 MG TABS: 500 | 30 days supply | Qty: 60 | Fill #2

## 2020-02-13 MED FILL — MONTELUKAST SOD 10 MG TAB: 10 | 30 days supply | Qty: 30 | Fill #1

## 2020-02-19 DIAGNOSIS — J3089 Other allergic rhinitis: Secondary | ICD-10-CM | POA: Diagnosis not present

## 2020-02-19 DIAGNOSIS — J301 Allergic rhinitis due to pollen: Secondary | ICD-10-CM | POA: Diagnosis not present

## 2020-02-19 DIAGNOSIS — J3081 Allergic rhinitis due to animal (cat) (dog) hair and dander: Secondary | ICD-10-CM | POA: Diagnosis not present

## 2020-02-21 DIAGNOSIS — J3081 Allergic rhinitis due to animal (cat) (dog) hair and dander: Secondary | ICD-10-CM | POA: Diagnosis not present

## 2020-02-21 DIAGNOSIS — J301 Allergic rhinitis due to pollen: Secondary | ICD-10-CM | POA: Diagnosis not present

## 2020-02-21 DIAGNOSIS — J3089 Other allergic rhinitis: Secondary | ICD-10-CM | POA: Diagnosis not present

## 2020-02-26 DIAGNOSIS — J301 Allergic rhinitis due to pollen: Secondary | ICD-10-CM | POA: Diagnosis not present

## 2020-02-26 DIAGNOSIS — J3089 Other allergic rhinitis: Secondary | ICD-10-CM | POA: Diagnosis not present

## 2020-02-26 DIAGNOSIS — J3081 Allergic rhinitis due to animal (cat) (dog) hair and dander: Secondary | ICD-10-CM | POA: Diagnosis not present

## 2020-03-03 DIAGNOSIS — J3081 Allergic rhinitis due to animal (cat) (dog) hair and dander: Secondary | ICD-10-CM | POA: Diagnosis not present

## 2020-03-03 DIAGNOSIS — J301 Allergic rhinitis due to pollen: Secondary | ICD-10-CM | POA: Diagnosis not present

## 2020-03-03 DIAGNOSIS — J3089 Other allergic rhinitis: Secondary | ICD-10-CM | POA: Diagnosis not present

## 2020-03-11 ENCOUNTER — Other Ambulatory Visit (HOSPITAL_COMMUNITY): Payer: Self-pay | Admitting: Obstetrics and Gynecology

## 2020-03-11 DIAGNOSIS — Z01419 Encounter for gynecological examination (general) (routine) without abnormal findings: Secondary | ICD-10-CM | POA: Diagnosis not present

## 2020-03-11 DIAGNOSIS — Z6822 Body mass index (BMI) 22.0-22.9, adult: Secondary | ICD-10-CM | POA: Diagnosis not present

## 2020-03-11 MED FILL — FLUoxetine HCL 40 MG CAPS: 40 | 30 days supply | Qty: 30 | Fill #0

## 2020-03-12 LAB — HM PAP SMEAR: HM Pap smear: NORMAL

## 2020-03-18 DIAGNOSIS — J3089 Other allergic rhinitis: Secondary | ICD-10-CM | POA: Diagnosis not present

## 2020-03-18 DIAGNOSIS — J3081 Allergic rhinitis due to animal (cat) (dog) hair and dander: Secondary | ICD-10-CM | POA: Diagnosis not present

## 2020-03-18 DIAGNOSIS — J301 Allergic rhinitis due to pollen: Secondary | ICD-10-CM | POA: Diagnosis not present

## 2020-04-03 DIAGNOSIS — J301 Allergic rhinitis due to pollen: Secondary | ICD-10-CM | POA: Diagnosis not present

## 2020-04-03 DIAGNOSIS — J3089 Other allergic rhinitis: Secondary | ICD-10-CM | POA: Diagnosis not present

## 2020-04-03 DIAGNOSIS — J3081 Allergic rhinitis due to animal (cat) (dog) hair and dander: Secondary | ICD-10-CM | POA: Diagnosis not present

## 2020-04-06 DIAGNOSIS — Z0000 Encounter for general adult medical examination without abnormal findings: Secondary | ICD-10-CM | POA: Diagnosis not present

## 2020-04-06 DIAGNOSIS — Z5181 Encounter for therapeutic drug level monitoring: Secondary | ICD-10-CM | POA: Diagnosis not present

## 2020-04-06 DIAGNOSIS — F411 Generalized anxiety disorder: Secondary | ICD-10-CM | POA: Diagnosis not present

## 2020-04-06 DIAGNOSIS — E782 Mixed hyperlipidemia: Secondary | ICD-10-CM | POA: Diagnosis not present

## 2020-04-06 DIAGNOSIS — E559 Vitamin D deficiency, unspecified: Secondary | ICD-10-CM | POA: Diagnosis not present

## 2020-04-06 MED FILL — FLUoxetine HCL 10 MG CAPS: 10 | 90 days supply | Qty: 180 | Fill #0

## 2020-04-22 DIAGNOSIS — J3081 Allergic rhinitis due to animal (cat) (dog) hair and dander: Secondary | ICD-10-CM | POA: Diagnosis not present

## 2020-04-22 DIAGNOSIS — J301 Allergic rhinitis due to pollen: Secondary | ICD-10-CM | POA: Diagnosis not present

## 2020-04-22 MED FILL — FLUoxetine HCL 10 MG CAPS: 10 | 90 days supply | Qty: 180 | Fill #0

## 2020-04-27 MED FILL — TRIAMCINOLONE 0.1% OINTMENT: 0.1 | 30 days supply | Qty: 454 | Fill #0

## 2020-04-28 MED FILL — FLUoxetine HCL 10 MG CAPS: 10 | 90 days supply | Qty: 180 | Fill #0

## 2020-04-28 MED FILL — MONTELUKAST SOD 10 MG TAB: 10 | 90 days supply | Qty: 90 | Fill #2

## 2020-04-28 MED FILL — SETLAKIN 0.15 MG-0.03 MG TA: 0.15-0.03 | 91 days supply | Qty: 91 | Fill #0

## 2020-04-28 MED FILL — NABUMETONE 500 MG TABS: 500 | 30 days supply | Qty: 60 | Fill #3

## 2020-05-07 DIAGNOSIS — J301 Allergic rhinitis due to pollen: Secondary | ICD-10-CM | POA: Diagnosis not present

## 2020-05-07 DIAGNOSIS — J3081 Allergic rhinitis due to animal (cat) (dog) hair and dander: Secondary | ICD-10-CM | POA: Diagnosis not present

## 2020-05-07 DIAGNOSIS — J3089 Other allergic rhinitis: Secondary | ICD-10-CM | POA: Diagnosis not present

## 2020-05-14 DIAGNOSIS — J3081 Allergic rhinitis due to animal (cat) (dog) hair and dander: Secondary | ICD-10-CM | POA: Diagnosis not present

## 2020-05-14 DIAGNOSIS — J301 Allergic rhinitis due to pollen: Secondary | ICD-10-CM | POA: Diagnosis not present

## 2020-05-14 DIAGNOSIS — J3089 Other allergic rhinitis: Secondary | ICD-10-CM | POA: Diagnosis not present

## 2020-05-26 ENCOUNTER — Ambulatory Visit: Payer: 59 | Attending: Internal Medicine

## 2020-05-26 DIAGNOSIS — Z23 Encounter for immunization: Secondary | ICD-10-CM

## 2020-05-26 NOTE — Progress Notes (Signed)
   Covid-19 Vaccination Clinic  Name:  Vanessa [name]    MRN: [id number] DOB: 11-02-1975  05/26/2020  [name] was observed post Covid-19 immunization for 15 minutes without incident. She was provided with Vaccine Information Sheet and instruction to access the V-Safe system.  Vaccine administered by [place] PharmD Student.   [name] was instructed to call 911 with any severe reactions post vaccine: Marland Kitchen Difficulty breathing  . Swelling of face and throat  . A fast heartbeat  . A bad rash all over body  . Dizziness and weakness

## 2020-06-03 DIAGNOSIS — J3081 Allergic rhinitis due to animal (cat) (dog) hair and dander: Secondary | ICD-10-CM | POA: Diagnosis not present

## 2020-06-03 DIAGNOSIS — J301 Allergic rhinitis due to pollen: Secondary | ICD-10-CM | POA: Diagnosis not present

## 2020-06-03 DIAGNOSIS — J3089 Other allergic rhinitis: Secondary | ICD-10-CM | POA: Diagnosis not present

## 2020-06-16 DIAGNOSIS — J3081 Allergic rhinitis due to animal (cat) (dog) hair and dander: Secondary | ICD-10-CM | POA: Diagnosis not present

## 2020-06-16 DIAGNOSIS — J3089 Other allergic rhinitis: Secondary | ICD-10-CM | POA: Diagnosis not present

## 2020-06-16 DIAGNOSIS — J301 Allergic rhinitis due to pollen: Secondary | ICD-10-CM | POA: Diagnosis not present

## 2020-07-08 DIAGNOSIS — J301 Allergic rhinitis due to pollen: Secondary | ICD-10-CM | POA: Diagnosis not present

## 2020-07-08 DIAGNOSIS — J3089 Other allergic rhinitis: Secondary | ICD-10-CM | POA: Diagnosis not present

## 2020-07-08 DIAGNOSIS — J3081 Allergic rhinitis due to animal (cat) (dog) hair and dander: Secondary | ICD-10-CM | POA: Diagnosis not present

## 2020-07-15 DIAGNOSIS — J3089 Other allergic rhinitis: Secondary | ICD-10-CM | POA: Diagnosis not present

## 2020-07-15 DIAGNOSIS — J301 Allergic rhinitis due to pollen: Secondary | ICD-10-CM | POA: Diagnosis not present

## 2020-07-15 DIAGNOSIS — J3081 Allergic rhinitis due to animal (cat) (dog) hair and dander: Secondary | ICD-10-CM | POA: Diagnosis not present

## 2020-07-22 DIAGNOSIS — J3081 Allergic rhinitis due to animal (cat) (dog) hair and dander: Secondary | ICD-10-CM | POA: Diagnosis not present

## 2020-07-22 DIAGNOSIS — J3089 Other allergic rhinitis: Secondary | ICD-10-CM | POA: Diagnosis not present

## 2020-07-22 DIAGNOSIS — J301 Allergic rhinitis due to pollen: Secondary | ICD-10-CM | POA: Diagnosis not present

## 2020-07-29 DIAGNOSIS — J301 Allergic rhinitis due to pollen: Secondary | ICD-10-CM | POA: Diagnosis not present

## 2020-07-29 DIAGNOSIS — J3081 Allergic rhinitis due to animal (cat) (dog) hair and dander: Secondary | ICD-10-CM | POA: Diagnosis not present

## 2020-07-29 DIAGNOSIS — J3089 Other allergic rhinitis: Secondary | ICD-10-CM | POA: Diagnosis not present

## 2020-07-31 ENCOUNTER — Other Ambulatory Visit (HOSPITAL_BASED_OUTPATIENT_CLINIC_OR_DEPARTMENT_OTHER): Payer: Self-pay | Admitting: Family Medicine

## 2020-07-31 MED FILL — FLUoxetine HCL 10 MG CAPS: 10 | 90 days supply | Qty: 270 | Fill #0

## 2020-08-07 MED FILL — SETLAKIN 0.15 MG-0.03 MG TA: 0.15-0.03 | 91 days supply | Qty: 91 | Fill #1

## 2020-08-17 DIAGNOSIS — J301 Allergic rhinitis due to pollen: Secondary | ICD-10-CM | POA: Diagnosis not present

## 2020-08-17 DIAGNOSIS — J3081 Allergic rhinitis due to animal (cat) (dog) hair and dander: Secondary | ICD-10-CM | POA: Diagnosis not present

## 2020-08-17 DIAGNOSIS — J3089 Other allergic rhinitis: Secondary | ICD-10-CM | POA: Diagnosis not present

## 2020-08-28 DIAGNOSIS — J301 Allergic rhinitis due to pollen: Secondary | ICD-10-CM | POA: Diagnosis not present

## 2020-08-28 DIAGNOSIS — J3081 Allergic rhinitis due to animal (cat) (dog) hair and dander: Secondary | ICD-10-CM | POA: Diagnosis not present

## 2020-08-28 DIAGNOSIS — J3089 Other allergic rhinitis: Secondary | ICD-10-CM | POA: Diagnosis not present

## 2020-09-02 DIAGNOSIS — J301 Allergic rhinitis due to pollen: Secondary | ICD-10-CM | POA: Diagnosis not present

## 2020-09-02 DIAGNOSIS — J3089 Other allergic rhinitis: Secondary | ICD-10-CM | POA: Diagnosis not present

## 2020-09-02 DIAGNOSIS — J3081 Allergic rhinitis due to animal (cat) (dog) hair and dander: Secondary | ICD-10-CM | POA: Diagnosis not present

## 2020-09-16 DIAGNOSIS — J3081 Allergic rhinitis due to animal (cat) (dog) hair and dander: Secondary | ICD-10-CM | POA: Diagnosis not present

## 2020-09-16 DIAGNOSIS — J301 Allergic rhinitis due to pollen: Secondary | ICD-10-CM | POA: Diagnosis not present

## 2020-09-17 DIAGNOSIS — J3089 Other allergic rhinitis: Secondary | ICD-10-CM | POA: Diagnosis not present

## 2020-09-19 HISTORY — PX: SHOULDER ARTHROSCOPY: SHX128

## 2020-09-25 DIAGNOSIS — J301 Allergic rhinitis due to pollen: Secondary | ICD-10-CM | POA: Diagnosis not present

## 2020-09-25 DIAGNOSIS — J3089 Other allergic rhinitis: Secondary | ICD-10-CM | POA: Diagnosis not present

## 2020-09-25 DIAGNOSIS — J3081 Allergic rhinitis due to animal (cat) (dog) hair and dander: Secondary | ICD-10-CM | POA: Diagnosis not present

## 2020-11-02 ENCOUNTER — Other Ambulatory Visit (HOSPITAL_BASED_OUTPATIENT_CLINIC_OR_DEPARTMENT_OTHER): Payer: Self-pay | Admitting: Family Medicine

## 2020-11-02 MED FILL — FLUoxetine HCL 40 MG CAPS: 40 | 90 days supply | Qty: 90 | Fill #0

## 2020-11-10 MED FILL — LEVONOR-ETH ESTRAD 0.15-0.0: 0.15-0.03 | 91 days supply | Qty: 91 | Fill #2

## 2020-11-19 MED FILL — LEVONOR-ETH ESTRAD 0.15-0.0: 0.15-0.03 | 91 days supply | Qty: 91 | Fill #2

## 2020-11-25 DIAGNOSIS — J3089 Other allergic rhinitis: Secondary | ICD-10-CM | POA: Diagnosis not present

## 2020-11-25 DIAGNOSIS — J301 Allergic rhinitis due to pollen: Secondary | ICD-10-CM | POA: Diagnosis not present

## 2020-11-25 DIAGNOSIS — J3081 Allergic rhinitis due to animal (cat) (dog) hair and dander: Secondary | ICD-10-CM | POA: Diagnosis not present

## 2020-12-16 DIAGNOSIS — J3081 Allergic rhinitis due to animal (cat) (dog) hair and dander: Secondary | ICD-10-CM | POA: Diagnosis not present

## 2020-12-16 DIAGNOSIS — J3089 Other allergic rhinitis: Secondary | ICD-10-CM | POA: Diagnosis not present

## 2020-12-16 DIAGNOSIS — J301 Allergic rhinitis due to pollen: Secondary | ICD-10-CM | POA: Diagnosis not present

## 2020-12-23 DIAGNOSIS — J301 Allergic rhinitis due to pollen: Secondary | ICD-10-CM | POA: Diagnosis not present

## 2020-12-23 DIAGNOSIS — J3089 Other allergic rhinitis: Secondary | ICD-10-CM | POA: Diagnosis not present

## 2020-12-23 DIAGNOSIS — J3081 Allergic rhinitis due to animal (cat) (dog) hair and dander: Secondary | ICD-10-CM | POA: Diagnosis not present

## 2020-12-31 DIAGNOSIS — J301 Allergic rhinitis due to pollen: Secondary | ICD-10-CM | POA: Diagnosis not present

## 2020-12-31 DIAGNOSIS — J3089 Other allergic rhinitis: Secondary | ICD-10-CM | POA: Diagnosis not present

## 2020-12-31 DIAGNOSIS — J3081 Allergic rhinitis due to animal (cat) (dog) hair and dander: Secondary | ICD-10-CM | POA: Diagnosis not present

## 2021-01-02 ENCOUNTER — Encounter (HOSPITAL_BASED_OUTPATIENT_CLINIC_OR_DEPARTMENT_OTHER): Payer: Self-pay | Admitting: Emergency Medicine

## 2021-01-02 ENCOUNTER — Other Ambulatory Visit: Payer: Self-pay

## 2021-01-02 ENCOUNTER — Emergency Department (HOSPITAL_BASED_OUTPATIENT_CLINIC_OR_DEPARTMENT_OTHER)
Admission: EM | Admit: 2021-01-02 | Discharge: 2021-01-02 | Disposition: A | Discharge status: Home / Self Care | Payer: 59 | Attending: Emergency Medicine | Admitting: Emergency Medicine

## 2021-01-02 ENCOUNTER — Emergency Department (HOSPITAL_BASED_OUTPATIENT_CLINIC_OR_DEPARTMENT_OTHER): Payer: 59

## 2021-01-02 DIAGNOSIS — Z87891 Personal history of nicotine dependence: Secondary | ICD-10-CM | POA: Insufficient documentation

## 2021-01-02 DIAGNOSIS — M25511 Pain in right shoulder: Secondary | ICD-10-CM | POA: Insufficient documentation

## 2021-01-02 DIAGNOSIS — S4991XA Unspecified injury of right shoulder and upper arm, initial encounter: Secondary | ICD-10-CM | POA: Diagnosis not present

## 2021-01-02 DIAGNOSIS — W19XXXA Unspecified fall, initial encounter: Secondary | ICD-10-CM

## 2021-01-02 MED ORDER — ACETAMINOPHEN 325 MG PO TABS
650.0000 mg | ORAL_TABLET | Freq: Once | ORAL | Status: AC
  Administered 2021-01-02: 650 mg via ORAL
  Filled 2021-01-02: qty 2

## 2021-01-02 NOTE — ED Triage Notes (Signed)
Reports right shoulder pain after falling while skating tonight.  Decreased ROM noted.

## 2021-01-02 NOTE — Discharge Instructions (Signed)
You may alternate taking Tylenol and Ibuprofen as needed for pain control. You may take 400-600 mg of ibuprofen every 6 hours and [phone] mg of Tylenol every 6 hours. Do not exceed 4000 mg of Tylenol daily as this can lead to liver damage. Also, make sure to take Ibuprofen with meals as it can cause an upset stomach. Do not take other NSAIDs while taking Ibuprofen such as (Aleve, Naprosyn, Aspirin, Celebrex, etc) and do not take more than the prescribed dose as this can lead to ulcers and bleeding in your GI tract. You may use warm and cold compresses to help with your symptoms.   Please follow up with your primary doctor within the next 7-10 days or follow up with the orthopedic provider, Dr. Raeford Razor, for re-evaluation and further treatment of your symptoms.   Please return to the ER sooner if you have any new or worsening symptoms.

## 2021-01-02 NOTE — ED Provider Notes (Signed)
[facility] EMERGENCY DEPARTMENT Provider Note   CSN: [id number] Arrival date & time: 01/02/21  2051     History Chief Complaint  Patient presents with  . Shoulder Pain    Vanessa [name] is a 45 y.o. female.  HPI   45 year old female presents to the emergency department today for evaluation after a fall.  She was rollerskating prior to arrival when she tried to come to a stop that ended up falling onto her right side.  States she landed on her right shoulder and now has severe shoulder pain.  Pain is worse with movement.  She has some associated decreased strength and sensation to the right hand.  She denies any head trauma or LOC and denies any other injuries.  Past Medical History:  Diagnosis Date  . Abnormal Pap smear   . AMA (advanced maternal age) multigravida 82+   . Gestational diabetes   . Hx of varicella     Patient Active Problem List   Diagnosis Date Noted  . SVD (spontaneous vaginal delivery) 01/22/2013  . Postpartum care following vaginal delivery (5/6) 01/22/2013    Past Surgical History:  Procedure Laterality Date  . AUGMENTATION MAMMAPLASTY Bilateral   . BACK SURGERY    . BREAST ENHANCEMENT SURGERY    . LEEP    . NASAL SEPTOPLASTY W/ TURBINOPLASTY Bilateral 03/08/2016   Procedure: BILATERAL NASAL SEPTOPLASTY WITH TURBINATE REDUCTION;  Surgeon: Rozetta Nunnery, MD;  Location: [facility];  Service: ENT;  Laterality: Bilateral;  . NASAL SINUS SURGERY Bilateral 03/08/2016   Procedure: TOTAL BILATERAL ETHMOIDECTOMY AND MAXILLARY OSTEA ENLARGEMENT;  Surgeon: Rozetta Nunnery, MD;  Location: [facility];  Service: ENT;  Laterality: Bilateral;     OB History    Gravida  1   Para  1   Term  1   Preterm  0   AB  0   Living  1     SAB  0   IAB  0   Ectopic  0   Multiple  0   Live Births  1           Family History  Problem Relation Age of Onset  . Rheum arthritis Mother   . Anemia Mother    . Migraines Mother   . Diabetes Father   . Hypertension Father   . Diabetes Maternal Grandmother   . Stroke Maternal Grandfather   . Heart disease Maternal Grandfather   . COPD Paternal Grandfather     Social History   Tobacco Use  . Smoking status: Former Smoker    Quit date: 11/24/1996    Years since quitting: 24.1  . Smokeless tobacco: Never Used  Substance Use Topics  . Alcohol use: No  . Drug use: No    Home Medications Prior to Admission medications   Medication Sig Start Date End Date Taking? Authorizing Provider  azithromycin (ZITHROMAX) 250 MG tablet Take 2 tablets on day 1, then take 1 tablet for the remaining 4 days. 10/19/19   Montine Circle, PA-C  FLUoxetine (PROZAC) 10 MG capsule TAKE 3 CAPSULES BY MOUTH EVERY MORNING 07/31/20 07/31/21  Maurice Small, MD  FLUoxetine (PROZAC) 10 MG tablet Take 10 mg by mouth daily.    [provider]  FLUoxetine (PROZAC) 40 MG capsule TAKE 1 CAPSULE BY MOUTH EVERY MORNING 11/02/20 11/02/21  Maurice Small, MD  fluticasone [facility]) 50 MCG/ACT nasal spray Place 2 sprays into both nostrils daily. 10/17/18   Evelina Dun  A, FNP  fluticasone (FLONASE) 50 MCG/ACT nasal spray Place 2 sprays into both nostrils daily for 10 days. 10/17/18 10/27/18  Kara Dies, NP  ibuprofen (ADVIL,MOTRIN) 200 MG tablet Take 200 mg by mouth every 6 (six) hours as needed.    [provider]  levonorgestrel-ethinyl estradiol (SEASONALE) 0.15-0.03 MG tablet TAKE 1 TABLET BY MOUTH DAILY 03/11/20 03/11/21  Sigmon, Tyler Deis, CNM  montelukast (SINGULAIR) 10 MG tablet Take 1 tablet (10 mg total) by mouth at bedtime for 30 days. 10/17/18 11/16/18  Kara Dies, NP  mupirocin cream (BACTROBAN) 2 % Apply 1 application topically 2 (two) times daily. 10/17/19   Volanda Napoleon, MD  predniSONE (STERAPRED UNI-PAK 21 TAB) 10 MG (21) TBPK tablet As directed x 6 days 02/03/20   Chevis Pretty, FNP  Pseudoeph-Doxylamine-DM-APAP (NYQUIL PO) Take  by mouth.    [provider]  Pseudoephedrine-APAP-DM (DAYQUIL PO) Take by mouth.    [provider]  Jarvis Newcomer 0.15-0.03 MG tablet  08/10/18   [provider]    Allergies    Penicillins and Sulfa antibiotics  Review of Systems   Review of Systems  Constitutional: Negative for fever.  Musculoskeletal:       Right shoulder pain  Skin: Negative for wound.  Neurological: Positive for weakness and numbness.       No head trauma or loc    Physical Exam Updated Vital Signs BP 124/77 (BP Location: Right Arm)   Pulse 69   Temp 98.6 F (37 C) (Oral)   Resp 18   Ht 5\' 6"  (1.676 m)   Wt 63.5 kg   LMP 12/25/2020   SpO2 100%   BMI 22.60 kg/m   Physical Exam Vitals and nursing note reviewed.  Constitutional:      General: She is not in acute distress.    Appearance: She is well-developed.  HENT:     Head: Normocephalic and atraumatic.  Eyes:     Conjunctiva/sclera: Conjunctivae normal.  Cardiovascular:     Rate and Rhythm: Normal rate.  Pulmonary:     Effort: Pulmonary effort is normal.  Musculoskeletal:        General: Normal range of motion.     Cervical back: Neck supple.     Comments: TTP to the right anterior shoulder along the [facility] joint. No TTP to the clavicle and no TTP noted to the elbow. Radial pulse is strong. There is some decreased right grip strength   Skin:    General: Skin is warm and dry.  Neurological:     Mental Status: She is alert.     ED Results / Procedures / Treatments   Labs (all labs ordered are listed, but only abnormal results are displayed) Labs Reviewed - No data to display  EKG None  Radiology DG Shoulder Right  Result Date: 01/02/2021 CLINICAL DATA:  Recent roller skating injury, initial encounter EXAM: RIGHT SHOULDER - 2+ VIEW COMPARISON:  None. FINDINGS: There is no evidence of fracture or dislocation. There is no evidence of arthropathy or other focal bone abnormality. Soft tissues are unremarkable.  IMPRESSION: No acute abnormality noted. Electronically Signed   By: Inez Catalina M.D.   On: 01/02/2021 21:51    Procedures Procedures   Medications Ordered in ED Medications  acetaminophen (TYLENOL) tablet 650 mg (650 mg Oral Given 01/02/21 2135)    ED Course  I have reviewed the triage vital signs and the nursing notes.  Pertinent labs & imaging results that were available  during my care of the patient were reviewed by me and considered in my medical decision making (see chart for details).    MDM Rules/Calculators/A&P                          89 old female presents for evaluation of mechanical fall.  Fell onto her right shoulder while rollerskating prior to arrival.  Has pain to the right anterior shoulder over the [facility] joint.  She reports some decreased sensation distally to the fingers and has some decreased strength secondary to pain however radial pulses strong.  She is some decreased range of motion secondary to pain.  The x-ray of the right shoulder is negative for any obvious fracture or dislocation.  Question AC joint injury versus rotator cuff injury.  Will place in sling, advised over-the-counter pain meds, have her follow-up with Ortho and return to the ED for new or worsening symptoms.  She voices understanding the plan and reasons to return.  All questions answered.  Patient stable for discharge.   Final Clinical Impression(s) / ED Diagnoses Final diagnoses:  Fall, initial encounter  Acute pain of right shoulder    Rx / DC Orders ED Discharge Orders    None       Rodney Booze, PA-C 01/02/21 2208    Lennice Sites, DO 01/02/21 2259

## 2021-01-02 NOTE — ED Notes (Signed)
See EDP assessment; pt stable at discharge

## 2021-01-05 ENCOUNTER — Ambulatory Visit: Payer: Self-pay

## 2021-01-05 ENCOUNTER — Other Ambulatory Visit: Payer: Self-pay

## 2021-01-05 ENCOUNTER — Ambulatory Visit (INDEPENDENT_AMBULATORY_CARE_PROVIDER_SITE_OTHER): Payer: 59 | Admitting: Family Medicine

## 2021-01-05 ENCOUNTER — Encounter: Payer: Self-pay | Admitting: Family Medicine

## 2021-01-05 VITALS — BP 107/80 | Ht 66.0 in | Wt 140.0 lb

## 2021-01-05 DIAGNOSIS — M25511 Pain in right shoulder: Secondary | ICD-10-CM

## 2021-01-05 DIAGNOSIS — S46011A Strain of muscle(s) and tendon(s) of the rotator cuff of right shoulder, initial encounter: Secondary | ICD-10-CM | POA: Diagnosis not present

## 2021-01-05 DIAGNOSIS — S43001A Unspecified subluxation of right shoulder joint, initial encounter: Secondary | ICD-10-CM | POA: Diagnosis not present

## 2021-01-05 NOTE — Assessment & Plan Note (Signed)
Had an injury the on 4/16.  Seems to have had a subluxation and then a subsequent partial tear of the supraspinatus.  Concerning given lack of abduction and flexion on exam. -Counseled on supportive care. -MRI to evaluate for rotator cuff tear

## 2021-01-05 NOTE — Assessment & Plan Note (Signed)
Injury occurred on 4/16.  Having changes around the biceps tendon which would suggest more of the labral change or joint issues.  Seems more likely to be subluxation based on history. -Counseled on home exercise therapy and supportive care. -Can use sling as needed.

## 2021-01-05 NOTE — Progress Notes (Signed)
Vanessa [name] - 45 y.o. female MRN [id number]  Date of birth: [birth year]  SUBJECTIVE:  Including CC & ROS.  No chief complaint on file.   Vanessa [name] is a 45 y.o. female that is presenting with right shoulder pain.  She had a fall backwards and landed on her shoulder.  Since that time she has had exquisite pain and has lack of abduction.  No history of similar pain.  Has been in the sling and been seen in the emergency department..  Independent review of the right shoulder x-ray from 4/16 shows no acute changes.   Review of Systems See HPI   HISTORY: Past Medical, Surgical, Social, and Family History Reviewed & Updated per EMR.   Pertinent Historical Findings include:  Past Medical History:  Diagnosis Date  . Abnormal Pap smear   . AMA (advanced maternal age) multigravida 84+   . Gestational diabetes   . Hx of varicella     Past Surgical History:  Procedure Laterality Date  . AUGMENTATION MAMMAPLASTY Bilateral   . BACK SURGERY    . BREAST ENHANCEMENT SURGERY    . LEEP    . NASAL SEPTOPLASTY W/ TURBINOPLASTY Bilateral 03/08/2016   Procedure: BILATERAL NASAL SEPTOPLASTY WITH TURBINATE REDUCTION;  Surgeon: Rozetta Nunnery, MD;  Location: [facility];  Service: ENT;  Laterality: Bilateral;  . NASAL SINUS SURGERY Bilateral 03/08/2016   Procedure: TOTAL BILATERAL ETHMOIDECTOMY AND MAXILLARY OSTEA ENLARGEMENT;  Surgeon: Rozetta Nunnery, MD;  Location: [facility];  Service: ENT;  Laterality: Bilateral;    Family History  Problem Relation Age of Onset  . Rheum arthritis Mother   . Anemia Mother   . Migraines Mother   . Diabetes Father   . Hypertension Father   . Diabetes Maternal Grandmother   . Stroke Maternal Grandfather   . Heart disease Maternal Grandfather   . COPD Paternal Grandfather     Social History   Socioeconomic History  . Marital status: Married    Spouse name: Not on file  . Number of children: Not on file  . Years of  education: Not on file  . Highest education level: Not on file  Occupational History  . Not on file  Tobacco Use  . Smoking status: Former Smoker    Quit date: 11/24/1996    Years since quitting: 24.1  . Smokeless tobacco: Never Used  Substance and Sexual Activity  . Alcohol use: No  . Drug use: No  . Sexual activity: Yes    Birth control/protection: Pill  Other Topics Concern  . Not on file  Social History Narrative  . Not on file   Social Determinants of Health   Financial Resource Strain: Not on file  Food Insecurity: Not on file  Transportation Needs: Not on file  Physical Activity: Not on file  Stress: Not on file  Social Connections: Not on file  Intimate Partner Violence: Not on file     PHYSICAL EXAM:  VS: BP 107/80 (BP Location: Left Arm, Patient Position: Sitting, Cuff Size: Normal)   Ht 5\' 6"  (1.676 m)   Wt 140 lb (63.5 kg)   LMP 12/25/2020   BMI 22.60 kg/m  Physical Exam Gen: NAD, alert, cooperative with exam, well-appearing MSK:  Right shoulder: Normal internal and external rotation. Lacks active flexion and abduction. Limited strength resistance. Neurovascular intact  Limited ultrasound: Right shoulder:  There appears to be encircling effusion of the biceps tendon with overlying soft tissue edema. Normal-appearing  subscapularis. Supraspinatus appears to have a partial tear of the anterior leaflet of the supraspinatus.  There is no overlying subacromial bursitis. Normal-appearing AC joint. No change of the posterior glenohumeral joint.  Summary: Findings suggest changes of the labrum and joint given the effusion on the biceps tendon and a partial incomplete tear of the supraspinatus.  Ultrasound and interpretation by Clearance Coots, MD    ASSESSMENT & PLAN:   Traumatic incomplete tear of right rotator cuff Had an injury the on 4/16.  Seems to have had a subluxation and then a subsequent partial tear of the supraspinatus.  Concerning given  lack of abduction and flexion on exam. -Counseled on supportive care. -MRI to evaluate for rotator cuff tear  Shoulder subluxation, right, initial encounter Injury occurred on 4/16.  Having changes around the biceps tendon which would suggest more of the labral change or joint issues.  Seems more likely to be subluxation based on history. -Counseled on home exercise therapy and supportive care. -Can use sling as needed.

## 2021-01-06 ENCOUNTER — Encounter: Payer: Self-pay | Admitting: Family Medicine

## 2021-01-07 ENCOUNTER — Other Ambulatory Visit: Payer: Self-pay

## 2021-01-07 ENCOUNTER — Ambulatory Visit
Admission: RE | Admit: 2021-01-07 | Discharge: 2021-01-07 | Disposition: A | Discharge status: Home / Self Care | Payer: 59 | Source: Ambulatory Visit | Attending: Family Medicine | Admitting: Family Medicine

## 2021-01-07 DIAGNOSIS — S46011A Strain of muscle(s) and tendon(s) of the rotator cuff of right shoulder, initial encounter: Secondary | ICD-10-CM

## 2021-01-07 DIAGNOSIS — S46811A Strain of other muscles, fascia and tendons at shoulder and upper arm level, right arm, initial encounter: Secondary | ICD-10-CM | POA: Diagnosis not present

## 2021-01-07 DIAGNOSIS — S43001A Unspecified subluxation of right shoulder joint, initial encounter: Secondary | ICD-10-CM

## 2021-01-11 ENCOUNTER — Other Ambulatory Visit: Payer: Self-pay

## 2021-01-11 ENCOUNTER — Other Ambulatory Visit (HOSPITAL_BASED_OUTPATIENT_CLINIC_OR_DEPARTMENT_OTHER): Payer: Self-pay

## 2021-01-11 ENCOUNTER — Telehealth (INDEPENDENT_AMBULATORY_CARE_PROVIDER_SITE_OTHER): Payer: 59 | Admitting: Family Medicine

## 2021-01-11 VITALS — Ht 66.0 in

## 2021-01-11 DIAGNOSIS — S46011D Strain of muscle(s) and tendon(s) of the rotator cuff of right shoulder, subsequent encounter: Secondary | ICD-10-CM

## 2021-01-11 DIAGNOSIS — M25511 Pain in right shoulder: Secondary | ICD-10-CM | POA: Diagnosis not present

## 2021-01-11 MED ORDER — HYDROCODONE-ACETAMINOPHEN 5-325 MG PO TABS
1.0000 | ORAL_TABLET | Freq: Three times a day (TID) | ORAL | 0 refills | Status: DC | PRN
  Filled 2021-01-11: qty 15, 5d supply, fill #0

## 2021-01-11 NOTE — Assessment & Plan Note (Signed)
MRI was confirming the rotator cuff tear of the supraspinatus but also showed the subscapularis.  Also had a small joint effusion with concern for labral tear. -Counseled on supportive care. -[place]. -Referral to orthopedic surgery

## 2021-01-11 NOTE — Progress Notes (Signed)
Virtual Visit via Video Note  I connected with Vanessa [name] on 01/11/21 at  1:20 PM EDT by a video enabled telemedicine application and verified that I am speaking with the correct person using two identifiers.  Location: Patient: work Provider: office   I discussed the limitations of evaluation and management by telemedicine and the availability of in person appointments. The patient expressed understanding and agreed to proceed.  History of Present Illness:  Vanessa [name] is a 45 year old female that is presenting after the MRI of her right shoulder.  This was demonstrating a full-thickness tear of the supraspinatus with partial thickness of the subscapularis.  Also shows a small joint effusion and labral tear.   Observations/Objective:   Assessment and Plan:  Traumatic incomplete tear of the right rotator cuff: MRI was confirming the rotator cuff tear of the supraspinatus but also showed the subscapularis.  Also had a small joint effusion with concern for labral tear. -Counseled on supportive care. -[place]. -Referral to orthopedic surgery  Follow Up Instructions:    I discussed the assessment and treatment plan with the patient. The patient was provided an opportunity to ask questions and all were answered. The patient agreed with the plan and demonstrated an understanding of the instructions.   The patient was advised to call back or seek an in-person evaluation if the symptoms worsen or if the condition fails to improve as anticipated.   Clearance Coots, MD

## 2021-01-12 ENCOUNTER — Other Ambulatory Visit: Payer: 59

## 2021-01-22 DIAGNOSIS — M47812 Spondylosis without myelopathy or radiculopathy, cervical region: Secondary | ICD-10-CM | POA: Diagnosis not present

## 2021-01-22 DIAGNOSIS — R200 Anesthesia of skin: Secondary | ICD-10-CM | POA: Diagnosis not present

## 2021-01-29 ENCOUNTER — Other Ambulatory Visit (HOSPITAL_BASED_OUTPATIENT_CLINIC_OR_DEPARTMENT_OTHER): Payer: Self-pay

## 2021-01-29 DIAGNOSIS — S46011A Strain of muscle(s) and tendon(s) of the rotator cuff of right shoulder, initial encounter: Secondary | ICD-10-CM | POA: Diagnosis not present

## 2021-01-29 DIAGNOSIS — M7551 Bursitis of right shoulder: Secondary | ICD-10-CM | POA: Diagnosis not present

## 2021-01-29 DIAGNOSIS — G8918 Other acute postprocedural pain: Secondary | ICD-10-CM | POA: Diagnosis not present

## 2021-01-29 MED ORDER — HYDROCODONE-ACETAMINOPHEN 5-325 MG PO TABS
ORAL_TABLET | ORAL | 0 refills | Status: DC
  Filled 2021-01-29: qty 20, 3d supply, fill #0

## 2021-02-01 ENCOUNTER — Other Ambulatory Visit (HOSPITAL_BASED_OUTPATIENT_CLINIC_OR_DEPARTMENT_OTHER): Payer: Self-pay

## 2021-02-01 MED ORDER — HYDROCODONE-ACETAMINOPHEN 5-325 MG PO TABS
ORAL_TABLET | ORAL | 0 refills | Status: DC
  Filled 2021-02-01: qty 20, 5d supply, fill #0

## 2021-02-18 ENCOUNTER — Ambulatory Visit: Payer: Self-pay | Admitting: Allergy & Immunology

## 2021-03-10 ENCOUNTER — Ambulatory Visit: Payer: 59 | Attending: Orthopaedic Surgery | Admitting: Physical Therapy

## 2021-03-10 ENCOUNTER — Other Ambulatory Visit: Payer: Self-pay

## 2021-03-10 DIAGNOSIS — R600 Localized edema: Secondary | ICD-10-CM | POA: Diagnosis not present

## 2021-03-10 DIAGNOSIS — M25611 Stiffness of right shoulder, not elsewhere classified: Secondary | ICD-10-CM | POA: Insufficient documentation

## 2021-03-10 DIAGNOSIS — M25511 Pain in right shoulder: Secondary | ICD-10-CM | POA: Insufficient documentation

## 2021-03-10 DIAGNOSIS — R293 Abnormal posture: Secondary | ICD-10-CM | POA: Insufficient documentation

## 2021-03-10 DIAGNOSIS — M6281 Muscle weakness (generalized): Secondary | ICD-10-CM | POA: Insufficient documentation

## 2021-03-10 NOTE — Therapy (Signed)
[facility] [address]  Eugenio Saenz [place], [place], [address] Phone: [phone]   Fax:  [phone]  Physical Therapy Evaluation  Patient Details  Name: Vanessa [name] MRN: [id number] Date of Birth: 06-22-1976 Referring Provider (PT): Melrose Nakayama, MD   Encounter Date: 03/10/2021   PT End of Session - 03/10/21 0802     Visit Number 1    Number of Visits 17    Date for PT Re-Evaluation 05/05/21    Authorization Type [facility]    PT Start Time 0802    PT Stop Time 0858    PT Time Calculation (min) 56 min    Activity Tolerance Patient tolerated treatment well    Behavior During Therapy [facility] for tasks assessed/performed             Past Medical History:  Diagnosis Date   Abnormal Pap smear    AMA (advanced maternal age) multigravida 35+    Gestational diabetes    Hx of varicella     Past Surgical History:  Procedure Laterality Date   AUGMENTATION MAMMAPLASTY Bilateral    BACK SURGERY     BREAST ENHANCEMENT SURGERY     LEEP     NASAL SEPTOPLASTY W/ TURBINOPLASTY Bilateral 03/08/2016   Procedure: BILATERAL NASAL SEPTOPLASTY WITH TURBINATE REDUCTION;  Surgeon: Rozetta Nunnery, MD;  Location: [place];  Service: ENT;  Laterality: Bilateral;   NASAL SINUS SURGERY Bilateral 03/08/2016   Procedure: TOTAL BILATERAL ETHMOIDECTOMY AND MAXILLARY OSTEA ENLARGEMENT;  Surgeon: Rozetta Nunnery, MD;  Location: [place];  Service: ENT;  Laterality: Bilateral;    There were no vitals filed for this visit.    Subjective Assessment - 03/10/21 0808     Subjective R RCR on 02/01/21. Sling x 1 month with no use of R UE. Released from sling and to go back to work as of 03/03/21 with lifting restrictions. Has difficulty washing and brushing her hair or reaching behind her back. Would like to be able run a marathon in Oct but has not been able to start training while in sling - recently started  walking on TM but motion of arm swing makes her shoulder sore.    Pertinent History 01/29/21 - R RTC repair    Patient Stated Goals "to be able run a Marathon in Oct and get back to swimming"    Currently in Pain? Yes    Pain Score 3     Pain Location Shoulder    Pain Orientation Right    Pain Descriptors / Indicators Tightness;Sore    Pain Type Surgical pain    Pain Radiating Towards pulling sensation in R upper arm    Pain Onset More than a month ago    Pain Frequency Intermittent    Aggravating Factors  arm swing while walking on TM; laundry; twisting motions with UE (connecting IV tubing at work) sustained elevated position of shoudler (washing/brushing hair)    Pain Relieving Factors Tylenol    Effect of Pain on Daily Activities limited everyday use of R UE                [facility] PT Assessment - 03/10/21 0802       Assessment   Medical Diagnosis R RTC repair    Referring Provider (PT) Melrose Nakayama, MD    Onset Date/Surgical Date 01/29/21    Hand Dominance Right    Next MD Visit 04/01/21    Prior  Therapy PT after carpal tunnel surgery      Precautions   Precautions Shoulder      Balance Screen   Has the patient fallen in the past 6 months Yes    How many times? 1 at time of injury (on rollerskates)    Has the patient had a decrease in activity level because of a fear of falling?  No    Is the patient reluctant to leave their home because of a fear of falling?  No      Home Ec[employer] residence      Prior Function   Level of Independence Independent    Vocation Full time employment    Vocation Requirements [employer]    Leisure running (marathon coming up in Oct); pilates; gardening; crochet      Cognition   Overall Cognitive Status Within Functional Limits for tasks assessed      Observation/Other Assessments   Focus on Therapeutic Outcomes (FOTO)  Shoulder = 34; predicted D/C FS = 73      Posture/Postural Control    Posture/Postural Control Postural limitations    Postural Limitations Forward head;Rounded Shoulders    Posture Comments mildly protracted R shoulder      ROM / Strength   AROM / PROM / Strength AROM;PROM;Strength      AROM   AROM Assessment Site Shoulder    Right/Left Shoulder Right;Left    Right Shoulder Flexion 80 Degrees    Right Shoulder ABduction 58 Degrees    Left Shoulder Flexion 158 Degrees    Left Shoulder ABduction 164 Degrees    Left Shoulder Internal Rotation 80 Degrees    Left Shoulder External Rotation 90 Degrees      PROM   PROM Assessment Site Shoulder    Right/Left Shoulder Right    Right Shoulder Flexion 97 Degrees    Right Shoulder ABduction 75 Degrees    Right Shoulder Internal Rotation 62 Degrees    Right Shoulder External Rotation 40 Degrees      Palpation   Palpation comment ttp in pecs, deltoids, proximal biceps, teres group, UT and LS                        Objective measurements completed on examination: See above findings.               PT Education - 03/10/21 0858     Education Details PT eval findings, anticipated POC & initial HEP - Access Code: [email or web address]    Person(s) Educated Patient    Methods Explanation;Demonstration;Verbal cues;Tactile cues;Handout    Comprehension Verbalized understanding;Verbal cues required;Tactile cues required;Returned demonstration;Need further instruction              PT Short Term Goals - 03/10/21 0858       PT SHORT TERM GOAL #1   Title Patient will be independent with initial HEP    Status New    Target Date 03/24/21      PT SHORT TERM GOAL #2   Title Patient to improve R shoulder PROM to [facility] without pain provocation    Status New    Target Date 04/07/21               PT Long Term Goals - 03/10/21 0858       PT LONG TERM GOAL #1   Title Patient will be independent with ongoing/advanced HEP for self-management at home  Status New    Target Date 05/05/21       PT LONG TERM GOAL #2   Title Improve posture and alignment with patient to demonstrate improved upright posture with posterior shoulder girdle engaged    Status New    Target Date 05/05/21      PT LONG TERM GOAL #3   Title Patient to improve R shoulder AROM to WFL/WNL without pain provocation    Status New    Target Date 05/05/21      PT LONG TERM GOAL #4   Title Patient will demonstrate improved R shoulder strength to >/= 4/5 for functional UE use    Status New    Target Date 05/05/21      PT LONG TERM GOAL #5   Title Patient to report ability to perform ADLs, household, and work-related tasks without limitation due to R shoulder pain, LOM or weakness    Status New    Target Date 05/05/21      PT LONG TERM GOAL #6   Title Patient will be able to resume light jogging/running as part of her marathon training w/o limitation due to R shoulder    Status New    Target Date 05/05/21                    Plan - 03/10/21 0907     Clinical Impression Statement Vanessa [name] is a 45 y/o female who presents to OP PT ~6 weeks s/p R shoulder arthroscopy for RTC repair on 01/29/21. She reports MD weaned her from the sling and released her to go back to work on 03/03/21. Deficits include pain in anterior and lateral shoulder; ttp in pecs, deltoids, proximal biceps, teres group, UT and LS; abnormal posture; decreased R shoulder A/PROM and R shoulder weakness. Pain, limited ROM and weakness create difficulty completing self-care including bathing and grooming as well as other ADLs and daily tasks at home and work. Provided initial HEP for pendulum and wand AAROM exercises as well as scapular retraction to promote better posture. Aireona will benefit from skilled PT per RTC repair protocol to address above deficits and restore functional R shoulder ROM and strength to allow her to resume normal daily activities without pain interference.    Personal Factors and Comorbidities Comorbidity 2;Past/Current  Experience;Time since onset of injury/illness/exacerbation    Comorbidities Cervical spondylosis with fusion 2013; Augmentation mammaplasty surgery    Examination-Activity Limitations Bathing;Dressing;Hygiene/Grooming;Toileting;Caring for Others;Lift;Carry;Reach Overhead    Examination-Participation Restrictions Cleaning;Community Activity;Driving;Interpersonal Relationship;Laundry;Meal Prep;Occupation;Shop;Yard Work    Stability/Clinical Decision Making Stable/Uncomplicated    Clinical Decision Making Low    Rehab Potential Excellent    PT Frequency 2x / week    PT Duration 8 weeks    PT Treatment/Interventions ADLs/Self Care Home Management;Cryotherapy;Electrical Stimulation;Iontophoresis 4mg /ml Dexamethasone;Moist Heat;Ultrasound;Functional mobility training;Therapeutic activities;Therapeutic exercise;Neuromuscular re-education;Patient/family education;Manual techniques;Scar mobilization;Passive range of motion;Dry needling;Taping;Vasopneumatic Device;Joint Manipulations    PT Next Visit Plan Review initial HEP; progress R shoulder P/AAROM (no UBE); manual therapy & modalities PRN    PT Home Exercise Plan Access Code: [email or web address] (6/22)    Consulted and Agree with Plan of Care Patient             Patient will benefit from skilled therapeutic intervention in order to improve the following deficits and impairments:  Decreased activity tolerance, Decreased endurance, Decreased knowledge of precautions, Decreased range of motion, Decreased safety awareness, Decreased scar mobility, Decreased strength, Increased fascial restricitons, Increased muscle spasms, Impaired perceived functional ability, Impaired flexibility, Impaired  UE functional use, Improper body mechanics, Postural dysfunction, Pain  Visit Diagnosis: Stiffness of right shoulder, not elsewhere classified  Acute pain of right shoulder  Abnormal posture  Muscle weakness (generalized)  Localized edema     Problem  List Patient Active Problem List   Diagnosis Date Noted   Shoulder subluxation, right, initial encounter 01/05/2021   Traumatic incomplete tear of right rotator cuff 01/05/2021   SVD (spontaneous vaginal delivery) 01/22/2013   Postpartum care following vaginal delivery (5/6) 01/22/2013    Percival Spanish, PT, MPT 03/10/2021, 2:35 PM  [facility] [address]  [place], [place], [address] Phone: [phone]   Fax:  [phone]  Name: Vanessa [name] MRN: [id number] Date of Birth: 10-Jun-1976

## 2021-03-10 NOTE — Patient Instructions (Addendum)
      Access Code: [email or web address] URL: [email or web address] Date: 03/10/2021 Prepared by: Annie Paras  Exercises Flexion-Extension Shoulder Pendulum with Table Support - 3 x daily - 7 x weekly - 2 sets - 20 reps Horizontal Shoulder Pendulum with Table Support - 3 x daily - 7 x weekly - 2 sets - 20 reps Circular Shoulder Pendulum with Table Support - 3 x daily - 7 x weekly - 2 sets - 20 reps Supine Shoulder Flexion AAROM with Dowel - 2-3 x daily - 7 x weekly - 2 sets - 10 reps - 3 sec hold Supine Shoulder External Rotation in 45 Degrees Abduction AAROM with Dowel - 2-3 x daily - 7 x weekly - 2 sets - 10 reps - 3 sec hold Supine Shoulder Scaption with Dowel - 2-3 x daily - 7 x weekly - 2 sets - 10 reps - 3 sec hold Seated Scapular Retraction - 3 x daily - 7 x weekly - 2 sets - 10 reps - 5 sec hold

## 2021-03-11 ENCOUNTER — Other Ambulatory Visit (HOSPITAL_BASED_OUTPATIENT_CLINIC_OR_DEPARTMENT_OTHER): Payer: Self-pay

## 2021-03-11 MED FILL — Levonorgestrel & Ethinyl Estradiol (91-Day) Tab 0.15-0.03 MG: ORAL | 91 days supply | Qty: 91 | Fill #0 | Status: AC

## 2021-03-11 MED FILL — Fluoxetine HCl Cap 40 MG: ORAL | 90 days supply | Qty: 90 | Fill #0 | Status: AC

## 2021-03-16 ENCOUNTER — Ambulatory Visit: Payer: 59

## 2021-03-16 ENCOUNTER — Other Ambulatory Visit: Payer: Self-pay

## 2021-03-16 DIAGNOSIS — M25611 Stiffness of right shoulder, not elsewhere classified: Secondary | ICD-10-CM | POA: Diagnosis not present

## 2021-03-16 DIAGNOSIS — M6281 Muscle weakness (generalized): Secondary | ICD-10-CM

## 2021-03-16 DIAGNOSIS — M25511 Pain in right shoulder: Secondary | ICD-10-CM

## 2021-03-16 DIAGNOSIS — R600 Localized edema: Secondary | ICD-10-CM

## 2021-03-16 DIAGNOSIS — R293 Abnormal posture: Secondary | ICD-10-CM | POA: Diagnosis not present

## 2021-03-16 NOTE — Therapy (Signed)
[facility] [address]  [place], [place], [address] Phone: [phone]   Fax:  [phone]  Physical Therapy Treatment  Patient Details  Name: Vanessa [name] MRN: [id number] Date of Birth: 01-03-1976 Referring Provider (PT): Melrose Nakayama, MD   Encounter Date: 03/16/2021   PT End of Session - 03/16/21 0929     Visit Number 2    Number of Visits 17    Date for PT Re-Evaluation 05/05/21    Authorization Type [facility]    PT Start Time 0848    PT Stop Time 0927    PT Time Calculation (min) 39 min    Activity Tolerance Patient tolerated treatment well    Behavior During Therapy [facility] for tasks assessed/performed             Past Medical History:  Diagnosis Date   Abnormal Pap smear    AMA (advanced maternal age) multigravida 35+    Gestational diabetes    Hx of varicella     Past Surgical History:  Procedure Laterality Date   AUGMENTATION MAMMAPLASTY Bilateral    BACK SURGERY     BREAST ENHANCEMENT SURGERY     LEEP     NASAL SEPTOPLASTY W/ TURBINOPLASTY Bilateral 03/08/2016   Procedure: BILATERAL NASAL SEPTOPLASTY WITH TURBINATE REDUCTION;  Surgeon: Rozetta Nunnery, MD;  Location: [place];  Service: ENT;  Laterality: Bilateral;   NASAL SINUS SURGERY Bilateral 03/08/2016   Procedure: TOTAL BILATERAL ETHMOIDECTOMY AND MAXILLARY OSTEA ENLARGEMENT;  Surgeon: Rozetta Nunnery, MD;  Location: [facility];  Service: ENT;  Laterality: Bilateral;    There were no vitals filed for this visit.   Subjective Assessment - 03/16/21 0851     Subjective Pt notes stiffness in the R shoulder. Having problems with ABD and ER AAROM exercises.    Pertinent History 01/29/21 - R RTC repair    Patient Stated Goals "to be able run a Marathon in Oct and get back to swimming"    Currently in Pain? No/denies                               [facility] Adult PT Treatment/Exercise -  03/16/21 0001       Exercises   Exercises Shoulder      Shoulder Exercises: Supine   Protraction AROM;Both;10 reps    Protraction Limitations with wand    External Rotation AAROM;Right;10 reps    External Rotation Limitations with wand; towel underneath elbow    Flexion AAROM;Both;10 reps    Flexion Limitations with wand; cues to emphasize full ROM    Other Supine Exercises scaption with wand 10 reps; cues for proper plane of motion      Shoulder Exercises: Pulleys   Flexion 3 minutes    Flexion Limitations to tolerance    Scaption 3 minutes    Scaption Limitations to tolerance      Shoulder Exercises: ROM/Strengthening   Wall Wash flex + scap+ circles both ways 20x                      PT Short Term Goals - 03/16/21 0930       PT SHORT TERM GOAL #1   Title Patient will be independent with initial HEP    Status On-going    Target Date 03/24/21      PT SHORT TERM GOAL #2  Title Patient to improve R shoulder PROM to [facility] without pain provocation    Status On-going    Target Date 04/07/21               PT Long Term Goals - 03/16/21 0930       PT LONG TERM GOAL #1   Title Patient will be independent with ongoing/advanced HEP for self-management at home    Status On-going      PT LONG TERM GOAL #2   Title Improve posture and alignment with patient to demonstrate improved upright posture with posterior shoulder girdle engaged    Status On-going      PT LONG TERM GOAL #3   Title Patient to improve R shoulder AROM to WFL/WNL without pain provocation    Status On-going      PT LONG TERM GOAL #4   Title Patient will demonstrate improved R shoulder strength to >/= 4/5 for functional UE use    Status On-going      PT LONG TERM GOAL #5   Title Patient to report ability to perform ADLs, household, and work-related tasks without limitation due to R shoulder pain, LOM or weakness    Status On-going      PT LONG TERM GOAL #6   Title Patient will be  able to resume light jogging/running as part of her marathon training w/o limitation due to R shoulder    Status On-going                   Plan - 03/16/21 0931     Clinical Impression Statement Pt is currently 6 weeks out from RTC surgery. She needed instructions to emphasize full ROM with exercises and for proper performance of the active assistive scaption and ER. She demonstrates decent AROM of the R shoulder but reported her arm feeling heavy with ER and scaption. She was able to complete wall washes w/o reports of pain. Reminded her of avoiding extension and motions away from the body per MD. She denied needing cues or instruction with pendulums for at home and overall responded well.    Personal Factors and Comorbidities Comorbidity 2;Past/Current Experience;Time since onset of injury/illness/exacerbation    Comorbidities Cervical spondylosis with fusion 2013; Augmentation mammaplasty surgery    PT Frequency 2x / week    PT Duration 8 weeks    PT Treatment/Interventions ADLs/Self Care Home Management;Cryotherapy;Electrical Stimulation;Iontophoresis 4mg /ml Dexamethasone;Moist Heat;Ultrasound;Functional mobility training;Therapeutic activities;Therapeutic exercise;Neuromuscular re-education;Patient/family education;Manual techniques;Scar mobilization;Passive range of motion;Dry needling;Taping;Vasopneumatic Device;Joint Manipulations    PT Next Visit Plan progress R shoulder P/AAROM (no UBE); manual therapy & modalities PRN    PT Home Exercise Plan Access Code: [email or web address] (6/22)    Consulted and Agree with Plan of Care Patient             Patient will benefit from skilled therapeutic intervention in order to improve the following deficits and impairments:  Decreased activity tolerance, Decreased endurance, Decreased knowledge of precautions, Decreased range of motion, Decreased safety awareness, Decreased scar mobility, Decreased strength, Increased fascial restricitons, Increased  muscle spasms, Impaired perceived functional ability, Impaired flexibility, Impaired UE functional use, Improper body mechanics, Postural dysfunction, Pain  Visit Diagnosis: Stiffness of right shoulder, not elsewhere classified  Acute pain of right shoulder  Abnormal posture  Muscle weakness (generalized)  Localized edema     Problem List Patient Active Problem List   Diagnosis Date Noted   Shoulder subluxation, right, initial encounter 01/05/2021   Traumatic incomplete tear of right rotator  cuff 01/05/2021   SVD (spontaneous vaginal delivery) 01/22/2013   Postpartum care following vaginal delivery (5/6) 01/22/2013    Artist Pais, PTA 03/16/2021, 10:26 AM  [facility] [address]  [place], [place], [address] Phone: [phone]   Fax:  [phone]  Name: Vanessa [name] MRN: [id number] Date of Birth: Feb 14, 1976

## 2021-03-19 ENCOUNTER — Ambulatory Visit: Payer: 59 | Attending: Orthopaedic Surgery | Admitting: Physical Therapy

## 2021-03-19 ENCOUNTER — Other Ambulatory Visit: Payer: Self-pay

## 2021-03-19 DIAGNOSIS — R293 Abnormal posture: Secondary | ICD-10-CM

## 2021-03-19 DIAGNOSIS — M25611 Stiffness of right shoulder, not elsewhere classified: Secondary | ICD-10-CM | POA: Diagnosis not present

## 2021-03-19 DIAGNOSIS — M25511 Pain in right shoulder: Secondary | ICD-10-CM | POA: Diagnosis not present

## 2021-03-19 DIAGNOSIS — M6281 Muscle weakness (generalized): Secondary | ICD-10-CM | POA: Diagnosis not present

## 2021-03-19 DIAGNOSIS — R600 Localized edema: Secondary | ICD-10-CM

## 2021-03-19 NOTE — Therapy (Signed)
[facility] [address]  [place], [place], [address] Phone: [phone]   Fax:  [phone]  Physical Therapy Treatment  Patient Details  Name: Vanessa [name] MRN: [id number] Date of Birth: May 25, 1976 Referring Provider (PT): Melrose Nakayama, MD   Encounter Date: 03/19/2021   PT End of Session - 03/19/21 0801     Visit Number 3    Number of Visits 17    Date for PT Re-Evaluation 05/05/21    Authorization Type [facility]    PT Start Time 0801    PT Stop Time 0858    PT Time Calculation (min) 57 min    Activity Tolerance Patient tolerated treatment well    Behavior During Therapy [facility] for tasks assessed/performed             Past Medical History:  Diagnosis Date   Abnormal Pap smear    AMA (advanced maternal age) multigravida 35+    Gestational diabetes    Hx of varicella     Past Surgical History:  Procedure Laterality Date   AUGMENTATION MAMMAPLASTY Bilateral    BACK SURGERY     BREAST ENHANCEMENT SURGERY     LEEP     NASAL SEPTOPLASTY W/ TURBINOPLASTY Bilateral 03/08/2016   Procedure: BILATERAL NASAL SEPTOPLASTY WITH TURBINATE REDUCTION;  Surgeon: Rozetta Nunnery, MD;  Location: [place];  Service: ENT;  Laterality: Bilateral;   NASAL SINUS SURGERY Bilateral 03/08/2016   Procedure: TOTAL BILATERAL ETHMOIDECTOMY AND MAXILLARY OSTEA ENLARGEMENT;  Surgeon: Rozetta Nunnery, MD;  Location: [place];  Service: ENT;  Laterality: Bilateral;    There were no vitals filed for this visit.   Subjective Assessment - 03/19/21 0806     Subjective Pt denies pain but does note some soreness or tightness. Pt states she was excited that she was almost able to sleep on her R side yesterday.    Pertinent History 01/29/21 - R RTC repair    Patient Stated Goals "to be able run a Marathon in Oct and get back to swimming"    Currently in Pain? No/denies                                [facility] Adult PT Treatment/Exercise - 03/19/21 0801       Shoulder Exercises: Supine   Protraction Both;10 reps;AROM    External Rotation Right;10 reps;AAROM    External Rotation Limitations wand with towel roll under elbow - cues to clarify movement pattern for shoulder rotation avoiding extending elbow    Other Supine Exercises R scaption with wand x 10; cues for proper plane of motion    Other Supine Exercises R shoulder CW/CCW circles at 90 flexion 2 x 10 each direction      Shoulder Exercises: Prone   Retraction Right;10 reps;AROM   2 sets   Retraction Limitations prone row    Extension Right;10 reps;AROM   2 sets     Shoulder Exercises: Sidelying   External Rotation Right;10 reps;AROM;Strengthening    External Rotation Limitations cues for scap retraction with towel roll under elbow      Shoulder Exercises: Standing   ABduction Right;10 reps;AAROM    ABduction Limitations wand      Shoulder Exercises: Pulleys   Flexion 3 minutes    Scaption 3 minutes      Shoulder Exercises: ROM/Strengthening   Wall Wash R shoulder flexion,  scaption & overhead short arc (~60) x 10 each      Modalities   Modalities Vasopneumatic      Vasopneumatic   Number Minutes Vasopneumatic  15 minutes    Vasopnuematic Location  Shoulder    Vasopneumatic Pressure Low    Vasopneumatic Temperature  34                      PT Short Term Goals - 03/16/21 0930       PT SHORT TERM GOAL #1   Title Patient will be independent with initial HEP    Status On-going    Target Date 03/24/21      PT SHORT TERM GOAL #2   Title Patient to improve R shoulder PROM to [facility] without pain provocation    Status On-going    Target Date 04/07/21               PT Long Term Goals - 03/16/21 0930       PT LONG TERM GOAL #1   Title Patient will be independent with ongoing/advanced HEP for self-management at home    Status On-going      PT LONG TERM  GOAL #2   Title Improve posture and alignment with patient to demonstrate improved upright posture with posterior shoulder girdle engaged    Status On-going      PT LONG TERM GOAL #3   Title Patient to improve R shoulder AROM to WFL/WNL without pain provocation    Status On-going      PT LONG TERM GOAL #4   Title Patient will demonstrate improved R shoulder strength to >/= 4/5 for functional UE use    Status On-going      PT LONG TERM GOAL #5   Title Patient to report ability to perform ADLs, household, and work-related tasks without limitation due to R shoulder pain, LOM or weakness    Status On-going      PT LONG TERM GOAL #6   Title Patient will be able to resume light jogging/running as part of her marathon training w/o limitation due to R shoulder    Status On-going                   Plan - 03/19/21 0807     Clinical Impression Statement Danira still questioning technique with wand abduction and ER, therefore review desired movement patterns with pt reporting better understanding following review. Progressed R shoulder AAROM and introduced scapular stabilization/strengthening to promote better control as we prepare to progress toward AROM. Cautioned pt to avoid trying to duplicate all exercises at home at this point to avoid increasing irritation in her shoulder. Session concluded with [employer] vasopnuematic compression to reduce post-exercise soreness and inflammation and pt encouraged to use ice at home PRN after exercising. Lanny is pleased with her improving function - noting ability to walk 3 miles on her TM with more natural arm swing as well as getting closer to being able to sleep on her R side.    Comorbidities Cervical spondylosis with fusion 2013; Augmentation mammaplasty surgery    Rehab Potential Excellent    PT Frequency 2x / week    PT Duration 8 weeks    PT Treatment/Interventions ADLs/Self Care Home Management;Cryotherapy;Electrical Stimulation;Iontophoresis  4mg /ml Dexamethasone;Moist Heat;Ultrasound;Functional mobility training;Therapeutic activities;Therapeutic exercise;Neuromuscular re-education;Patient/family education;Manual techniques;Scar mobilization;Passive range of motion;Dry needling;Taping;Vasopneumatic Device;Joint Manipulations    PT Next Visit Plan progress R shoulder P/AA/AROM (no UBE); initiate isometrics; manual therapy &  modalities PRN    PT Home Exercise Plan Access Code: [email or web address] (6/22)    Consulted and Agree with Plan of Care Patient             Patient will benefit from skilled therapeutic intervention in order to improve the following deficits and impairments:  Decreased activity tolerance, Decreased endurance, Decreased knowledge of precautions, Decreased range of motion, Decreased safety awareness, Decreased scar mobility, Decreased strength, Increased fascial restricitons, Increased muscle spasms, Impaired perceived functional ability, Impaired flexibility, Impaired UE functional use, Improper body mechanics, Postural dysfunction, Pain  Visit Diagnosis: Stiffness of right shoulder, not elsewhere classified  Acute pain of right shoulder  Abnormal posture  Muscle weakness (generalized)  Localized edema     Problem List Patient Active Problem List   Diagnosis Date Noted   Shoulder subluxation, right, initial encounter 01/05/2021   Traumatic incomplete tear of right rotator cuff 01/05/2021   SVD (spontaneous vaginal delivery) 01/22/2013   Postpartum care following vaginal delivery (5/6) 01/22/2013    Percival Spanish, PT, MPT 03/19/2021, 11:54 AM  [facility] [address]  [place], [place], [address] Phone: [phone]   Fax:  [phone]  Name: AYRIANA WIX MRN: [id number] Date of Birth: [birth year]

## 2021-03-23 ENCOUNTER — Other Ambulatory Visit (HOSPITAL_BASED_OUTPATIENT_CLINIC_OR_DEPARTMENT_OTHER): Payer: Self-pay

## 2021-03-23 MED ORDER — CARESTART COVID-19 HOME TEST VI KIT
PACK | 0 refills | Status: DC
  Filled 2021-03-23: qty 2, 4d supply, fill #0

## 2021-03-24 ENCOUNTER — Ambulatory Visit: Payer: 59

## 2021-03-24 ENCOUNTER — Other Ambulatory Visit: Payer: Self-pay

## 2021-03-24 DIAGNOSIS — M6281 Muscle weakness (generalized): Secondary | ICD-10-CM

## 2021-03-24 DIAGNOSIS — M25611 Stiffness of right shoulder, not elsewhere classified: Secondary | ICD-10-CM

## 2021-03-24 DIAGNOSIS — R293 Abnormal posture: Secondary | ICD-10-CM

## 2021-03-24 DIAGNOSIS — R600 Localized edema: Secondary | ICD-10-CM

## 2021-03-24 DIAGNOSIS — M25511 Pain in right shoulder: Secondary | ICD-10-CM | POA: Diagnosis not present

## 2021-03-24 NOTE — Therapy (Signed)
[facility] [address]  [place], [place], [address] Phone: [phone]   Fax:  [phone]  Physical Therapy Treatment  Patient Details  Name: Vanessa [name] MRN: [id number] Date of Birth: [birth year] Referring Provider (PT): Melrose Nakayama, MD   Encounter Date: 03/24/2021   PT End of Session - 03/24/21 1137     Visit Number 4    Number of Visits 17    Date for PT Re-Evaluation 05/05/21    Authorization Type [facility]    PT Start Time 1103    PT Stop Time 4656   pt requested short session   PT Time Calculation (min) 28 min    Activity Tolerance Patient tolerated treatment well    Behavior During Therapy [facility] for tasks assessed/performed             Past Medical History:  Diagnosis Date   Abnormal Pap smear    AMA (advanced maternal age) multigravida 35+    Gestational diabetes    Hx of varicella     Past Surgical History:  Procedure Laterality Date   AUGMENTATION MAMMAPLASTY Bilateral    BACK SURGERY     BREAST ENHANCEMENT SURGERY     LEEP     NASAL SEPTOPLASTY W/ TURBINOPLASTY Bilateral 03/08/2016   Procedure: BILATERAL NASAL SEPTOPLASTY WITH TURBINATE REDUCTION;  Surgeon: Rozetta Nunnery, MD;  Location: [place];  Service: ENT;  Laterality: Bilateral;   NASAL SINUS SURGERY Bilateral 03/08/2016   Procedure: TOTAL BILATERAL ETHMOIDECTOMY AND MAXILLARY OSTEA ENLARGEMENT;  Surgeon: Rozetta Nunnery, MD;  Location: [place];  Service: ENT;  Laterality: Bilateral;    There were no vitals filed for this visit.   Subjective Assessment - 03/24/21 1106     Subjective Pt ran 5k over the weekend. Pt had discomfort mostly in R UT area but not a lot of pain. States that she tried avoiding running too hard to injure her R shoulder.    Pertinent History 01/29/21 - R RTC repair    Patient Stated Goals "to be able run a Marathon in Oct and get back to swimming"    Currently in  Pain? No/denies                               [facility] Adult PT Treatment/Exercise - 03/24/21 0001       Exercises   Exercises Shoulder      Shoulder Exercises: Seated   Flexion AAROM;Both;10 reps    Flexion Limitations with wand    Other Seated Exercises flexion and abduction stretch with green pball 10x5"      Shoulder Exercises: Standing   Flexion AAROM;Both;10 reps    Flexion Limitations hands clasped together    ABduction Right;10 reps;AAROM    ABduction Limitations wand    Other Standing Exercises B shoulder flexion and R crossbody reach with beach ball 10 reps      Shoulder Exercises: Pulleys   Flexion 3 minutes    Scaption 3 minutes                      PT Short Term Goals - 03/24/21 1138       PT SHORT TERM GOAL #1   Title Patient will be independent with initial HEP    Status Achieved    Target Date 03/24/21      PT SHORT TERM GOAL #  2   Title Patient to improve R shoulder PROM to [facility] without pain provocation    Status On-going    Target Date 04/07/21               PT Long Term Goals - 03/16/21 0930       PT LONG TERM GOAL #1   Title Patient will be independent with ongoing/advanced HEP for self-management at home    Status On-going      PT LONG TERM GOAL #2   Title Improve posture and alignment with patient to demonstrate improved upright posture with posterior shoulder girdle engaged    Status On-going      PT LONG TERM GOAL #3   Title Patient to improve R shoulder AROM to WFL/WNL without pain provocation    Status On-going      PT LONG TERM GOAL #4   Title Patient will demonstrate improved R shoulder strength to >/= 4/5 for functional UE use    Status On-going      PT LONG TERM GOAL #5   Title Patient to report ability to perform ADLs, household, and work-related tasks without limitation due to R shoulder pain, LOM or weakness    Status On-going      PT LONG TERM GOAL #6   Title Patient will be able to  resume light jogging/running as part of her marathon training w/o limitation due to R shoulder    Status On-going                   Plan - 03/24/21 1138     Clinical Impression Statement Session had to be shortened today because patient had to go back upstairs to work. She ran in a race over the weekend. She noted that tried to avoid running too hard to injure her R shoulder. The only complaint that she had is UT soreness. Continued with AAROM to her tolerance. She does not report any pain with the exercises but notes difficulty with the concept of AAROM abduction with wand even though she showed good from with this. Due to her reporting soreness and palpable tenderness and taut bands of muscle in her R UT area, we could try STM to address this next session.    Personal Factors and Comorbidities Comorbidity 2;Past/Current Experience;Time since onset of injury/illness/exacerbation    Comorbidities Cervical spondylosis with fusion 2013; Augmentation mammaplasty surgery    Examination-Activity Limitations Bathing;Dressing;Hygiene/Grooming;Toileting;Caring for Others;Lift;Carry;Reach Overhead    PT Frequency 2x / week    PT Duration 8 weeks    PT Treatment/Interventions ADLs/Self Care Home Management;Cryotherapy;Electrical Stimulation;Iontophoresis 4mg /ml Dexamethasone;Moist Heat;Ultrasound;Functional mobility training;Therapeutic activities;Therapeutic exercise;Neuromuscular re-education;Patient/family education;Manual techniques;Scar mobilization;Passive range of motion;Dry needling;Taping;Vasopneumatic Device;Joint Manipulations    PT Next Visit Plan progress R shoulder P/AA/AROM (no UBE); initiate isometrics; manual therapy & modalities PRN    PT Home Exercise Plan Access Code: [email or web address] (6/22)    Consulted and Agree with Plan of Care Patient             Patient will benefit from skilled therapeutic intervention in order to improve the following deficits and impairments:  Decreased  activity tolerance, Decreased endurance, Decreased knowledge of precautions, Decreased range of motion, Decreased safety awareness, Decreased scar mobility, Decreased strength, Increased fascial restricitons, Increased muscle spasms, Impaired perceived functional ability, Impaired flexibility, Impaired UE functional use, Improper body mechanics, Postural dysfunction, Pain  Visit Diagnosis: Stiffness of right shoulder, not elsewhere classified  Acute pain of right shoulder  Abnormal posture  Muscle weakness (  generalized)  Localized edema     Problem List Patient Active Problem List   Diagnosis Date Noted   Shoulder subluxation, right, initial encounter 01/05/2021   Traumatic incomplete tear of right rotator cuff 01/05/2021   SVD (spontaneous vaginal delivery) 01/22/2013   Postpartum care following vaginal delivery (5/6) 01/22/2013    Artist Pais, PTA 03/24/2021, 12:10 PM  [facility] [address]  [place], [place], [address] Phone: [phone]   Fax:  [phone]  Name: JELISHA WEED MRN: [id number] Date of Birth: [birth year]

## 2021-03-26 ENCOUNTER — Encounter: Payer: Self-pay | Admitting: Physical Therapy

## 2021-03-29 ENCOUNTER — Ambulatory Visit: Payer: 59 | Admitting: Physical Therapy

## 2021-03-29 ENCOUNTER — Other Ambulatory Visit: Payer: Self-pay

## 2021-03-29 ENCOUNTER — Encounter: Payer: Self-pay | Admitting: Physical Therapy

## 2021-03-29 DIAGNOSIS — M6281 Muscle weakness (generalized): Secondary | ICD-10-CM | POA: Diagnosis not present

## 2021-03-29 DIAGNOSIS — M25611 Stiffness of right shoulder, not elsewhere classified: Secondary | ICD-10-CM

## 2021-03-29 DIAGNOSIS — M25511 Pain in right shoulder: Secondary | ICD-10-CM | POA: Diagnosis not present

## 2021-03-29 DIAGNOSIS — R293 Abnormal posture: Secondary | ICD-10-CM | POA: Diagnosis not present

## 2021-03-29 DIAGNOSIS — R600 Localized edema: Secondary | ICD-10-CM | POA: Diagnosis not present

## 2021-03-29 NOTE — Therapy (Signed)
[facility] [address]  [place], [place], [address] Phone: [phone]   Fax:  [phone]  Physical Therapy Treatment  Patient Details  Name: Vanessa [name] MRN: [id number] Date of Birth: [birth year] Referring Provider (PT): Melrose Nakayama, MD   Encounter Date: 03/29/2021   PT End of Session - 03/29/21 1530     Visit Number 5    Number of Visits 17    Date for PT Re-Evaluation 05/05/21    Authorization Type [facility]    PT Start Time 3762    PT Stop Time 8315    PT Time Calculation (min) 44 min    Activity Tolerance Patient tolerated treatment well    Behavior During Therapy [facility] for tasks assessed/performed             Past Medical History:  Diagnosis Date   Abnormal Pap smear    AMA (advanced maternal age) multigravida 35+    Gestational diabetes    Hx of varicella     Past Surgical History:  Procedure Laterality Date   AUGMENTATION MAMMAPLASTY Bilateral    BACK SURGERY     BREAST ENHANCEMENT SURGERY     LEEP     NASAL SEPTOPLASTY W/ TURBINOPLASTY Bilateral 03/08/2016   Procedure: BILATERAL NASAL SEPTOPLASTY WITH TURBINATE REDUCTION;  Surgeon: Rozetta Nunnery, MD;  Location: [place];  Service: ENT;  Laterality: Bilateral;   NASAL SINUS SURGERY Bilateral 03/08/2016   Procedure: TOTAL BILATERAL ETHMOIDECTOMY AND MAXILLARY OSTEA ENLARGEMENT;  Surgeon: Rozetta Nunnery, MD;  Location: [place];  Service: ENT;  Laterality: Bilateral;    There were no vitals filed for this visit.   Subjective Assessment - 03/29/21 1535     Subjective Pt reports she ran a 10K over the weekend - the first 3 miles were fine but felt she had to hold onto her bra strap for the 2nd 3 miles because the arm swing was too much - shoulder feels tight now, but no pain although still weak.    Pertinent History 01/29/21 - R RTC repair    Patient Stated Goals "to be able run a Marathon in Oct and  get back to swimming"    Currently in Pain? No/denies                [facility] PT Assessment - 03/29/21 1530       Assessment   Medical Diagnosis R RTC repair    Referring Provider (PT) Melrose Nakayama, MD    Onset Date/Surgical Date 01/29/21    Hand Dominance Right    Next MD Visit 04/02/21                           [facility] Adult PT Treatment/Exercise - 03/29/21 1530       Exercises   Exercises Shoulder      Shoulder Exercises: Supine   Protraction Right;10 reps;Strengthening;Weights    Protraction Weight (lbs) 2    Flexion Right;10 reps;AROM;Strengthening    Shoulder Flexion Weight (lbs) 1   x 3 reps   Other Supine Exercises R shoulder CW/CCW circles at 90 flexion 2# x 10 each direction      Shoulder Exercises: Sidelying   External Rotation Right;10 reps;AROM;Strengthening    External Rotation Weight (lbs) 1    External Rotation Limitations cues for scap retraction with towel roll under elbow    ABduction Right;AROM;10 reps;Strengthening  ABduction Limitations 20-100; VC & TC for [facility] & scapular control    Other Sidelying Exercises R shoulder CW/CCW circles at 90 ABD/scaption 1# x 10 each direction      Shoulder Exercises: Pulleys   Flexion 3 minutes    Scaption 3 minutes      Shoulder Exercises: Isometric Strengthening   Flexion Limitations 10 x 5" - 50% effort    Extension Limitations 10 x 5" - 50% effort    External Rotation Limitations 10 x 5" - 50% effort    Internal Rotation Limitations 10 x 5" - 50% effort    ABduction Limitations 10 x 5" - 50% effort      Vasopneumatic   Number Minutes Vasopneumatic  --   pt declined as she had to get back to work but will ice at home tonight     Manual Therapy   Manual Therapy Soft tissue mobilization;Myofascial release    Soft tissue mobilization STM/STM to R anterior & lateral deltoid, teres groups and lats    Myofascial Release manual TPR to R anterior & lateral deltoid, teres groups and lats -  improve overhead ROM following MT                    PT Education - 03/29/21 1614     Education Details HEP update - Shoulder isometrics    Person(s) Educated Patient    Methods Explanation;Demonstration;Verbal cues;Handout    Comprehension Verbalized understanding;Verbal cues required;Returned demonstration;Need further instruction              PT Short Term Goals - 03/24/21 1138       PT SHORT TERM GOAL #1   Title Patient will be independent with initial HEP    Status Achieved    Target Date 03/24/21      PT SHORT TERM GOAL #2   Title Patient to improve R shoulder PROM to [facility] without pain provocation    Status On-going    Target Date 04/07/21               PT Long Term Goals - 03/16/21 0930       PT LONG TERM GOAL #1   Title Patient will be independent with ongoing/advanced HEP for self-management at home    Status On-going      PT LONG TERM GOAL #2   Title Improve posture and alignment with patient to demonstrate improved upright posture with posterior shoulder girdle engaged    Status On-going      PT LONG TERM GOAL #3   Title Patient to improve R shoulder AROM to WFL/WNL without pain provocation    Status On-going      PT LONG TERM GOAL #4   Title Patient will demonstrate improved R shoulder strength to >/= 4/5 for functional UE use    Status On-going      PT LONG TERM GOAL #5   Title Patient to report ability to perform ADLs, household, and work-related tasks without limitation due to R shoulder pain, LOM or weakness    Status On-going      PT LONG TERM GOAL #6   Title Patient will be able to resume light jogging/running as part of her marathon training w/o limitation due to R shoulder    Status On-going                   Plan - 03/29/21 [place] is very pleased  she was able to run a 10K this weekend but did note that she had to hold onto her bra strap to limit her arm swing for the 2nd 3  miles and her shoulder feels stiffer today following the race but no increased pain. Manual therapy able to relieve some of the tightness/stiffness allowing for increased ease of overhead motion but AROM control remains limited by weakness and decreased coordination of scapular motion. Introduced isometrics with ~50% effort with good muscle activation and no pain. Will continue to progress AROM and gentle scapular/RTC strengthening in upcoming visits.    Comorbidities Cervical spondylosis with fusion 2013; Augmentation mammaplasty surgery    Examination-Activity Limitations Bathing;Dressing;Hygiene/Grooming;Toileting;Caring for Others;Lift;Carry;Reach Overhead    Examination-Participation Restrictions Cleaning;Community Activity;Driving;Interpersonal Relationship;Laundry;Meal Prep;Occupation;Shop;Yard Work    Dispensing optician    PT Frequency 2x / week    PT Duration 8 weeks    PT Treatment/Interventions ADLs/Self Care Home Management;Cryotherapy;Electrical Stimulation;Iontophoresis 4mg /ml Dexamethasone;Moist Heat;Ultrasound;Functional mobility training;Therapeutic activities;Therapeutic exercise;Neuromuscular re-education;Patient/family education;Manual techniques;Scar mobilization;Passive range of motion;Dry needling;Taping;Vasopneumatic Device;Joint Manipulations    PT Next Visit Plan MD PN for appt on 04/02/21; progress R shoulder P/AA/AROM (no UBE); gentle scapular and RTC strengthening; manual therapy & modalities PRN    PT Home Exercise Plan Access Code: [email or web address] (6/22), [email or web address] (7/11)    Consulted and Agree with Plan of Care Patient             Patient will benefit from skilled therapeutic intervention in order to improve the following deficits and impairments:  Decreased activity tolerance, Decreased endurance, Decreased knowledge of precautions, Decreased range of motion, Decreased safety awareness, Decreased scar mobility, Decreased strength, Increased fascial restricitons,  Increased muscle spasms, Impaired perceived functional ability, Impaired flexibility, Impaired UE functional use, Improper body mechanics, Postural dysfunction, Pain  Visit Diagnosis: Stiffness of right shoulder, not elsewhere classified  Acute pain of right shoulder  Abnormal posture  Muscle weakness (generalized)  Localized edema     Problem List Patient Active Problem List   Diagnosis Date Noted   Shoulder subluxation, right, initial encounter 01/05/2021   Traumatic incomplete tear of right rotator cuff 01/05/2021   SVD (spontaneous vaginal delivery) 01/22/2013   Postpartum care following vaginal delivery (5/6) 01/22/2013    Percival Spanish, PT, MPT 03/29/2021, 4:38 PM  [facility] [place], [place], [address] Phone: [phone]   Fax:  [phone]  Name: Vanessa [name] MRN: [id number] Date of Birth: 04-Dec-1975

## 2021-03-29 NOTE — Patient Instructions (Addendum)
    Access Code: [email or web address] URL: [email or web address] Date: 03/29/2021 Prepared by: Annie Paras  Exercises Isometric Shoulder Flexion at Wall - 1 x daily - 7 x weekly - 2 sets - 5 reps - 5 sec hold Isometric Shoulder Abduction at Wall - 1 x daily - 7 x weekly - 2 sets - 5 reps - 5 sec hold Isometric Shoulder Extension at Wall - 1 x daily - 7 x weekly - 2 sets - 5 reps - 5 sec hold Standing Isometric Shoulder Internal Rotation at Doorway - 1 x daily - 7 x weekly - 2 sets - 5 reps - 5 sec hold Standing Isometric Shoulder External Rotation with Doorway - 1 x daily - 7 x weekly - 2 sets - 5 reps - 5 sec hold

## 2021-03-31 ENCOUNTER — Ambulatory Visit: Payer: 59

## 2021-03-31 ENCOUNTER — Other Ambulatory Visit: Payer: Self-pay

## 2021-03-31 DIAGNOSIS — R293 Abnormal posture: Secondary | ICD-10-CM | POA: Diagnosis not present

## 2021-03-31 DIAGNOSIS — M25511 Pain in right shoulder: Secondary | ICD-10-CM | POA: Diagnosis not present

## 2021-03-31 DIAGNOSIS — R600 Localized edema: Secondary | ICD-10-CM | POA: Diagnosis not present

## 2021-03-31 DIAGNOSIS — M25611 Stiffness of right shoulder, not elsewhere classified: Secondary | ICD-10-CM | POA: Diagnosis not present

## 2021-03-31 DIAGNOSIS — M6281 Muscle weakness (generalized): Secondary | ICD-10-CM

## 2021-03-31 NOTE — Therapy (Signed)
[facility] [address]  [place], [place], [address] Phone: [phone]   Fax:  [phone]  Physical Therapy Treatment  Patient Details  Name: Vanessa [name] MRN: [id number] Date of Birth: [birth year] Referring Provider (PT): Melrose Nakayama, MD   Encounter Date: 03/31/2021   PT End of Session - 03/31/21 0935     Visit Number 6    Number of Visits 17    Date for PT Re-Evaluation 05/05/21    Authorization Type [facility]    PT Start Time 0846    PT Stop Time 0930    PT Time Calculation (min) 44 min    Activity Tolerance Patient tolerated treatment well    Behavior During Therapy [facility] for tasks assessed/performed             Past Medical History:  Diagnosis Date   Abnormal Pap smear    AMA (advanced maternal age) multigravida 35+    Gestational diabetes    Hx of varicella     Past Surgical History:  Procedure Laterality Date   AUGMENTATION MAMMAPLASTY Bilateral    BACK SURGERY     BREAST ENHANCEMENT SURGERY     LEEP     NASAL SEPTOPLASTY W/ TURBINOPLASTY Bilateral 03/08/2016   Procedure: BILATERAL NASAL SEPTOPLASTY WITH TURBINATE REDUCTION;  Surgeon: Rozetta Nunnery, MD;  Location: [place];  Service: ENT;  Laterality: Bilateral;   NASAL SINUS SURGERY Bilateral 03/08/2016   Procedure: TOTAL BILATERAL ETHMOIDECTOMY AND MAXILLARY OSTEA ENLARGEMENT;  Surgeon: Rozetta Nunnery, MD;  Location: [place];  Service: ENT;  Laterality: Bilateral;    There were no vitals filed for this visit.   Subjective Assessment - 03/31/21 0846     Subjective Pt notes the massage from last session helped but she was sore afterwards. Now she feels better.    Pertinent History 01/29/21 - R RTC repair    Patient Stated Goals "to be able run a Marathon in Oct and get back to swimming"    Currently in Pain? No/denies                [facility] PT Assessment - 03/31/21 0001       AROM    Right Shoulder Flexion 157 Degrees    Right Shoulder ABduction 140 Degrees                           [facility] Adult PT Treatment/Exercise - 03/31/21 0001       Exercises   Exercises Shoulder      Shoulder Exercises: Supine   Flexion AROM;Right;10 reps;Weights    Shoulder Flexion Weight (lbs) 1      Shoulder Exercises: Seated   Flexion AROM;Right;10 reps;Weights    Flexion Weight (lbs) 1    Abduction AROM;Right;10 reps;Weights    ABduction Weight (lbs) 1      Shoulder Exercises: Sidelying   External Rotation Right;10 reps;Weights    External Rotation Weight (lbs) 1    ABduction AROM;Right;10 reps;Weights    ABduction Weight (lbs) 1      Shoulder Exercises: Pulleys   Flexion 3 minutes    Scaption 3 minutes      Shoulder Exercises: Isometric Strengthening   Flexion 5X5"    Flexion Limitations 50% effort    External Rotation 5X5"    External Rotation Limitations 50% effort    ABduction 5X5"    ABduction Limitations 50% effort  Manual Therapy   Manual Therapy Soft tissue mobilization;Myofascial release;Joint mobilization    Joint Mobilization gentle R GH joint distractions with prolonged holds    Soft tissue mobilization STM/STM to R anterior & lateral deltoid, teres groups and levator    Myofascial Release manual TPR to R anterior & lateral deltoid, teres groups and levator                      PT Short Term Goals - 03/24/21 1138       PT SHORT TERM GOAL #1   Title Patient will be independent with initial HEP    Status Achieved    Target Date 03/24/21      PT SHORT TERM GOAL #2   Title Patient to improve R shoulder PROM to [facility] without pain provocation    Status On-going    Target Date 04/07/21               PT Long Term Goals - 03/31/21 0958       PT LONG TERM GOAL #1   Title Patient will be independent with ongoing/advanced HEP for self-management at home    Status On-going      PT LONG TERM GOAL #2   Title Improve  posture and alignment with patient to demonstrate improved upright posture with posterior shoulder girdle engaged    Status On-going      PT LONG TERM GOAL #3   Title Patient to improve R shoulder AROM to WFL/WNL without pain provocation    Status Partially Met   much improvement with R shoulder AROM no pain     PT LONG TERM GOAL #4   Title Patient will demonstrate improved R shoulder strength to >/= 4/5 for functional UE use    Status On-going      PT LONG TERM GOAL #5   Title Patient to report ability to perform ADLs, household, and work-related tasks without limitation due to R shoulder pain, LOM or weakness    Status On-going      PT LONG TERM GOAL #6   Title Patient will be able to resume light jogging/running as part of her marathon training w/o limitation due to R shoulder    Status On-going                   Plan - 03/31/21 0946     Clinical Impression Statement Vanessa [name] is progressing well with R shoulder ROM. Flexion was measured 157 deg and abduction at 140 deg. She has been doing well with her exercises at home and during therapy session. She does not report having pain before or after exercises. She noted improvement from the manual work last session, did have soreness for the next 2-3 days but it eased up after. We did more STM today to relieve stiffness in the R shoulder and also did gentle GH joint distractions to relieve pressure from the joint. She declined GR post session today as she had to return to work.    Personal Factors and Comorbidities Comorbidity 2;Past/Current Experience;Time since onset of injury/illness/exacerbation    Comorbidities Cervical spondylosis with fusion 2013; Augmentation mammaplasty surgery    PT Frequency 2x / week    PT Duration 8 weeks    PT Treatment/Interventions ADLs/Self Care Home Management;Cryotherapy;Electrical Stimulation;Iontophoresis 54m/ml Dexamethasone;Moist Heat;Ultrasound;Functional mobility training;Therapeutic  activities;Therapeutic exercise;Neuromuscular re-education;Patient/family education;Manual techniques;Scar mobilization;Passive range of motion;Dry needling;Taping;Vasopneumatic Device;Joint Manipulations    PT Next Visit Plan MD PN for appt on 04/02/21;  progress R shoulder P/AA/AROM (no UBE); gentle scapular and RTC strengthening; manual therapy & modalities PRN    PT Home Exercise Plan Access Code: [email or web address] (6/22), [email or web address] (7/11)    Consulted and Agree with Plan of Care Patient             Patient will benefit from skilled therapeutic intervention in order to improve the following deficits and impairments:  Decreased activity tolerance, Decreased endurance, Decreased knowledge of precautions, Decreased range of motion, Decreased safety awareness, Decreased scar mobility, Decreased strength, Increased fascial restricitons, Increased muscle spasms, Impaired perceived functional ability, Impaired flexibility, Impaired UE functional use, Improper body mechanics, Postural dysfunction, Pain  Visit Diagnosis: Stiffness of right shoulder, not elsewhere classified  Acute pain of right shoulder  Abnormal posture  Muscle weakness (generalized)  Localized edema     Problem List Patient Active Problem List   Diagnosis Date Noted   Shoulder subluxation, right, initial encounter 01/05/2021   Traumatic incomplete tear of right rotator cuff 01/05/2021   SVD (spontaneous vaginal delivery) 01/22/2013   Postpartum care following vaginal delivery (5/6) 01/22/2013    Artist Pais, PTA 03/31/2021, 9:59 AM  [facility] [address]  [place], [place], [address] Phone: [phone]   Fax:  [phone]  Name: Vanessa [name] MRN: [id number] Date of Birth: Jul 03, 1976

## 2021-04-01 ENCOUNTER — Encounter: Payer: Self-pay | Admitting: Physical Therapy

## 2021-04-05 ENCOUNTER — Ambulatory Visit: Payer: 59 | Admitting: Physical Therapy

## 2021-04-05 ENCOUNTER — Encounter: Payer: Self-pay | Admitting: Physical Therapy

## 2021-04-05 ENCOUNTER — Other Ambulatory Visit: Payer: Self-pay

## 2021-04-05 DIAGNOSIS — M6281 Muscle weakness (generalized): Secondary | ICD-10-CM

## 2021-04-05 DIAGNOSIS — M25611 Stiffness of right shoulder, not elsewhere classified: Secondary | ICD-10-CM | POA: Diagnosis not present

## 2021-04-05 DIAGNOSIS — R293 Abnormal posture: Secondary | ICD-10-CM

## 2021-04-05 DIAGNOSIS — R600 Localized edema: Secondary | ICD-10-CM | POA: Diagnosis not present

## 2021-04-05 DIAGNOSIS — M25511 Pain in right shoulder: Secondary | ICD-10-CM | POA: Diagnosis not present

## 2021-04-05 NOTE — Patient Instructions (Signed)
    Access Code: [email or web address] URL: [email or web address] Date: 04/05/2021 Prepared by: Annie Paras  Exercises Shoulder Internal Rotation Reactive Isometrics - 1 x daily - 3-4 x weekly - 1 sets - 10 reps - 3 sec hold Shoulder External Rotation Reactive Isometrics - 1 x daily - 3-4 x weekly - 1 sets - 10 reps - 3 sec hold Standing Bilateral Low Shoulder Row with Anchored Resistance - 1 x daily - 4-5 x weekly - 2 sets - 10 reps - 5 sec hold Scapular Retraction with Resistance Advanced - 1 x daily - 4-5 x weekly - 2 sets - 10 reps - 5 sec hold

## 2021-04-05 NOTE — Therapy (Signed)
[facility] [address]  [place], [place], [address] Phone: [phone]   Fax:  [phone]  Physical Therapy Treatment  Patient Details  Name: Vanessa [name] MRN: [id number] Date of Birth: Oct 22, 1975 Referring Provider (PT): Melrose Nakayama, MD   Encounter Date: 04/05/2021   PT End of Session - 04/05/21 1615     Visit Number 7    Number of Visits 17    Date for PT Re-Evaluation 05/05/21    Authorization Type [facility]    PT Start Time 1615    PT Stop Time 1711    PT Time Calculation (min) 56 min    Activity Tolerance Patient tolerated treatment well    Behavior During Therapy WFL for tasks assessed/performed             Past Medical History:  Diagnosis Date   Abnormal Pap smear    AMA (advanced maternal age) multigravida 35+    Gestational diabetes    Hx of varicella     Past Surgical History:  Procedure Laterality Date   AUGMENTATION MAMMAPLASTY Bilateral    BACK SURGERY     BREAST ENHANCEMENT SURGERY     LEEP     NASAL SEPTOPLASTY W/ TURBINOPLASTY Bilateral 03/08/2016   Procedure: BILATERAL NASAL SEPTOPLASTY WITH TURBINATE REDUCTION;  Surgeon: Rozetta Nunnery, MD;  Location: [place];  Service: ENT;  Laterality: Bilateral;   NASAL SINUS SURGERY Bilateral 03/08/2016   Procedure: TOTAL BILATERAL ETHMOIDECTOMY AND MAXILLARY OSTEA ENLARGEMENT;  Surgeon: Rozetta Nunnery, MD;  Location: [place];  Service: ENT;  Laterality: Bilateral;    There were no vitals filed for this visit.   Subjective Assessment - 04/05/21 1616     Subjective Pt pleased with ehr improving AROM.    Pertinent History 01/29/21 - R RTC repair    Patient Stated Goals "to be able run a Marathon in Oct and get back to swimming"    Currently in Pain? No/denies                [facility] PT Assessment - 04/05/21 1615       Assessment   Medical Diagnosis R RTC repair    Referring Provider  (PT) Melrose Nakayama, MD    Onset Date/Surgical Date 01/29/21    Hand Dominance Right    Next MD Visit 04/30/21                           [facility] Adult PT Treatment/Exercise - 04/05/21 1615       Exercises   Exercises Shoulder      Shoulder Exercises: Prone   Extension Both;10 reps;AROM;Strengthening    Extension Limitations I's over greeb Pball    External Rotation Both;10 reps;AROM;Strengthening    External Rotation Limitations W's over greeb Pball    Horizontal ABduction 1 Both;10 reps;AROM;Strengthening    Horizontal ABduction 1 Limitations T's over greeb Pball    Horizontal ABduction 2 Both;10 reps;AROM;Strengthening    Horizontal ABduction 2 Limitations Y's over greeb Pball      Shoulder Exercises: Standing   External Rotation Right;10 reps;Strengthening;Theraband    Theraband Level (Shoulder External Rotation) Level 1 (Yellow)    External Rotation Limitations isometric step-out    Internal Rotation Right;10 reps;Strengthening;Theraband    Theraband Level (Shoulder Internal Rotation) Level 1 (Yellow)    Internal Rotation Limitations isometric step-out    Row Both;15 reps;Strengthening;Theraband  Theraband Level (Shoulder Row) Level 1 (Yellow)    Row Limitations cues for scap retraction & depression avoiding excessive shoulder extension or upward rotation of elbows    Retraction Both;15 reps;Strengthening;Theraband    Theraband Level (Shoulder Retraction) Level 1 (Yellow)    Retraction Limitations + mini-shoulder extension      Shoulder Exercises: Pulleys   Flexion 3 minutes    Scaption 3 minutes      Shoulder Exercises: Therapy Ball   Flexion Both;10 reps    Flexion Limitations orange Pball on wall    Scaption Right;10 reps    Scaption Limitations orange Pball on wall      Shoulder Exercises: ROM/Strengthening   Ball on Wall R shoulder CW/CCW circles x 10 each and ABCs at 90 flexion with small ball on wall    Rhythmic Stabilization, Seated  Perturbations with R arm oustretched and hand on orange PBall at 90 flexion 2 x 30 sec      Shoulder Exercises: Stretch   Internal Rotation Stretch Limitations Towel stretch 10 x 5"    External Rotation Stretch 2 reps;30 seconds   mid doorway pec stretch at 90/90     Vasopneumatic   Number Minutes Vasopneumatic  10 minutes    Vasopnuematic Location  Shoulder    Vasopneumatic Pressure Low    Vasopneumatic Temperature  34                    PT Education - 04/05/21 1708     Education Details HEP update - progression of shoulder isometrics, resisted scapular strengthening - Access Code: [email or web address]    Person(s) Educated Patient    Methods Explanation;Demonstration;Verbal cues;Handout    Comprehension Verbalized understanding;Verbal cues required;Returned demonstration;Need further instruction              PT Short Term Goals - 04/05/21 1617       PT SHORT TERM GOAL #1   Title Patient will be independent with initial HEP    Status Achieved   03/24/21   Target Date 03/24/21      PT SHORT TERM GOAL #2   Title Patient to improve R shoulder PROM to [facility] without pain provocation    Status Achieved   04/05/21   Target Date 04/07/21               PT Long Term Goals - 04/05/21 1619       PT LONG TERM GOAL #1   Title Patient will be independent with ongoing/advanced HEP for self-management at home    Status On-going    Target Date 05/05/21      PT LONG TERM GOAL #2   Title Improve posture and alignment with patient to demonstrate improved upright posture with posterior shoulder girdle engaged    Status On-going    Target Date 05/05/21      PT LONG TERM GOAL #3   Title Patient to improve R shoulder AROM to WFL/WNL without pain provocation    Status Partially Met   much improvement with R shoulder AROM no pain   Target Date 05/05/21      PT LONG TERM GOAL #4   Title Patient will demonstrate improved R shoulder strength to >/= 4/5 for functional UE use    Status  On-going    Target Date 05/05/21      PT LONG TERM GOAL #5   Title Patient to report ability to perform ADLs, household, and work-related tasks without limitation due to R shoulder  pain, LOM or weakness    Status On-going    Target Date 05/05/21      PT LONG TERM GOAL #6   Title Patient will be able to resume light jogging/running as part of her marathon training w/o limitation due to R shoulder    Status On-going    Target Date 05/05/21                   Plan - 04/05/21 1620     Clinical Impression Statement Modestine reports no new specific guidelines from MD visit on Friday and A/PROM essentially near full ROM, therefore proceeded with scapular and postural strengthening progression adding gravity and theraband resisted exercises, as well as progression of stretching, AROM and isometric strengthening. Cues necessary for proper technique and avoiding shoulder shrugs or excessive motion. All exercises well tolerated and HEP updated to reflect exercise progression. Given extent of exercise progression, session concluded with GR vasopnuematic compression to reduce risk for post-exercise soreness or inflammation.    Comorbidities Cervical spondylosis with fusion 2013; Augmentation mammaplasty surgery    Rehab Potential Excellent    PT Frequency 2x / week    PT Duration 8 weeks    PT Treatment/Interventions ADLs/Self Care Home Management;Cryotherapy;Electrical Stimulation;Iontophoresis 51m/ml Dexamethasone;Moist Heat;Ultrasound;Functional mobility training;Therapeutic activities;Therapeutic exercise;Neuromuscular re-education;Patient/family education;Manual techniques;Scar mobilization;Passive range of motion;Dry needling;Taping;Vasopneumatic Device;Joint Manipulations    PT Next Visit Plan progress R shoulder P/AA/AROM (no UBE); gentle scapular and RTC strengthening; manual therapy & modalities PRN    PT Home Exercise Plan Access Code: [email or web address] (6/22), [email or web address](7/11), [email or web address]  (7/18)     Consulted and Agree with Plan of Care Patient             Patient will benefit from skilled therapeutic intervention in order to improve the following deficits and impairments:  Decreased activity tolerance, Decreased endurance, Decreased knowledge of precautions, Decreased range of motion, Decreased safety awareness, Decreased scar mobility, Decreased strength, Increased fascial restricitons, Increased muscle spasms, Impaired perceived functional ability, Impaired flexibility, Impaired UE functional use, Improper body mechanics, Postural dysfunction, Pain  Visit Diagnosis: Stiffness of right shoulder, not elsewhere classified  Acute pain of right shoulder  Abnormal posture  Muscle weakness (generalized)  Localized edema     Problem List Patient Active Problem List   Diagnosis Date Noted   Shoulder subluxation, right, initial encounter 01/05/2021   Traumatic incomplete tear of right rotator cuff 01/05/2021   SVD (spontaneous vaginal delivery) 01/22/2013   Postpartum care following vaginal delivery (5/6) 01/22/2013    JPercival Spanish PT, MPT 04/05/2021, 5:36 PM  [facility] [address] [place] [address]Phone: [phone]  Fax:  [phone] Name: Vanessa WITHEMMRN: [id number] of Birth: [birth year]

## 2021-04-08 DIAGNOSIS — Z5181 Encounter for therapeutic drug level monitoring: Secondary | ICD-10-CM | POA: Diagnosis not present

## 2021-04-08 DIAGNOSIS — Z0000 Encounter for general adult medical examination without abnormal findings: Secondary | ICD-10-CM | POA: Diagnosis not present

## 2021-04-08 DIAGNOSIS — E782 Mixed hyperlipidemia: Secondary | ICD-10-CM | POA: Diagnosis not present

## 2021-04-14 ENCOUNTER — Ambulatory Visit: Payer: 59

## 2021-04-14 ENCOUNTER — Other Ambulatory Visit (HOSPITAL_BASED_OUTPATIENT_CLINIC_OR_DEPARTMENT_OTHER): Payer: Self-pay

## 2021-04-14 ENCOUNTER — Other Ambulatory Visit: Payer: Self-pay

## 2021-04-14 DIAGNOSIS — R293 Abnormal posture: Secondary | ICD-10-CM

## 2021-04-14 DIAGNOSIS — R600 Localized edema: Secondary | ICD-10-CM

## 2021-04-14 DIAGNOSIS — M25611 Stiffness of right shoulder, not elsewhere classified: Secondary | ICD-10-CM | POA: Diagnosis not present

## 2021-04-14 DIAGNOSIS — M25511 Pain in right shoulder: Secondary | ICD-10-CM | POA: Diagnosis not present

## 2021-04-14 DIAGNOSIS — M6281 Muscle weakness (generalized): Secondary | ICD-10-CM | POA: Diagnosis not present

## 2021-04-14 NOTE — Therapy (Signed)
[facility] [address]  [place], [place], [address] Phone: [phone]   Fax:  [phone]  Physical Therapy Treatment  Patient Details  Name: Vanessa [name] MRN: [id number] Date of Birth: 31-Jul-1976 Referring Provider (PT): Melrose Nakayama, MD   Encounter Date: 04/14/2021   PT End of Session - 04/14/21 1445     Visit Number 8    Number of Visits 17    Date for PT Re-Evaluation 05/05/21    Authorization Type [facility]    PT Start Time 1404    PT Stop Time 7062    PT Time Calculation (min) 41 min    Activity Tolerance Patient tolerated treatment well    Behavior During Therapy [facility] for tasks assessed/performed             Past Medical History:  Diagnosis Date   Abnormal Pap smear    AMA (advanced maternal age) multigravida 35+    Gestational diabetes    Hx of varicella     Past Surgical History:  Procedure Laterality Date   AUGMENTATION MAMMAPLASTY Bilateral    BACK SURGERY     BREAST ENHANCEMENT SURGERY     LEEP     NASAL SEPTOPLASTY W/ TURBINOPLASTY Bilateral 03/08/2016   Procedure: BILATERAL NASAL SEPTOPLASTY WITH TURBINATE REDUCTION;  Surgeon: Rozetta Nunnery, MD;  Location: [place];  Service: ENT;  Laterality: Bilateral;   NASAL SINUS SURGERY Bilateral 03/08/2016   Procedure: TOTAL BILATERAL ETHMOIDECTOMY AND MAXILLARY OSTEA ENLARGEMENT;  Surgeon: Rozetta Nunnery, MD;  Location: [place];  Service: ENT;  Laterality: Bilateral;    There were no vitals filed for this visit.   Subjective Assessment - 04/14/21 1404     Subjective Pt reports that she has been improving with AROM and trying band exercises.    Pertinent History 01/29/21 - R RTC repair    Patient Stated Goals "to be able run a Marathon in Oct and get back to swimming"    Currently in Pain? No/denies                               [facility] Adult PT Treatment/Exercise - 04/14/21  0001       Exercises   Exercises Shoulder      Shoulder Exercises: Seated   External Rotation Strengthening;Right;20 reps;Weights    External Rotation Weight (lbs) 2    External Rotation Limitations arm rested on pball    Flexion Strengthening;Right;10 reps;Theraband    Theraband Level (Shoulder Flexion) Level 1 (Yellow)      Shoulder Exercises: Prone   Extension Strengthening;Right;20 reps;Weights    Extension Weight (lbs) 1    Horizontal ABduction 1 Strengthening;Right;20 reps;Weights    Horizontal ABduction 1 Weight (lbs) 1    Horizontal ABduction 1 Limitations Ts prone    Other Prone Exercises rows with 1# weight 2x10    Other Prone Exercises Ys with 1# weight 2x10      Shoulder Exercises: Standing   External Rotation Strengthening;Right;10 reps;Theraband    Theraband Level (Shoulder External Rotation) Level 2 (Red)    External Rotation Limitations isometric step-out; concentric 10x with red TB    Internal Rotation Right;10 reps;Strengthening;Theraband    Theraband Level (Shoulder Internal Rotation) Level 1 (Yellow)    Internal Rotation Limitations isometric step-out    Extension Strengthening;Both;20 reps;Theraband    Theraband Level (Shoulder Extension) Level 1 (Yellow)  Row Strengthening;Both;20 reps;Theraband    Theraband Level (Shoulder Row) Level 1 (Yellow)      Shoulder Exercises: Pulleys   Flexion 3 minutes    Scaption 3 minutes                      PT Short Term Goals - 04/05/21 1617       PT SHORT TERM GOAL #1   Title Patient will be independent with initial HEP    Status Achieved   03/24/21   Target Date 03/24/21      PT SHORT TERM GOAL #2   Title Patient to improve R shoulder PROM to [facility] without pain provocation    Status Achieved   04/05/21   Target Date 04/07/21               PT Long Term Goals - 04/05/21 1619       PT LONG TERM GOAL #1   Title Patient will be independent with ongoing/advanced HEP for self-management at home     Status On-going    Target Date 05/05/21      PT LONG TERM GOAL #2   Title Improve posture and alignment with patient to demonstrate improved upright posture with posterior shoulder girdle engaged    Status On-going    Target Date 05/05/21      PT LONG TERM GOAL #3   Title Patient to improve R shoulder AROM to WFL/WNL without pain provocation    Status Partially Met   much improvement with R shoulder AROM no pain   Target Date 05/05/21      PT LONG TERM GOAL #4   Title Patient will demonstrate improved R shoulder strength to >/= 4/5 for functional UE use    Status On-going    Target Date 05/05/21      PT LONG TERM GOAL #5   Title Patient to report ability to perform ADLs, household, and work-related tasks without limitation due to R shoulder pain, LOM or weakness    Status On-going    Target Date 05/05/21      PT LONG TERM GOAL #6   Title Patient will be able to resume light jogging/running as part of her marathon training w/o limitation due to R shoulder    Status On-going    Target Date 05/05/21                   Plan - 04/14/21 1446     Clinical Impression Statement Pt responded well to treatment. She reported that she was just advised to avoid any activities that would strain her R shoulder. She showed a good demonstration of the exercises but cues were required for full engagement of the periscap muscles with rows/ext. R shoulder AROM still doing good with no pain reaching OH. She showed compliance with updated HEP and is progressing well.    Personal Factors and Comorbidities Comorbidity 2;Past/Current Experience;Time since onset of injury/illness/exacerbation    Comorbidities Cervical spondylosis with fusion 2013; Augmentation mammaplasty surgery    PT Frequency 2x / week    PT Duration 8 weeks    PT Treatment/Interventions ADLs/Self Care Home Management;Cryotherapy;Electrical Stimulation;Iontophoresis 74m/ml Dexamethasone;Moist Heat;Ultrasound;Functional  mobility training;Therapeutic activities;Therapeutic exercise;Neuromuscular re-education;Patient/family education;Manual techniques;Scar mobilization;Passive range of motion;Dry needling;Taping;Vasopneumatic Device;Joint Manipulations    PT Next Visit Plan progress R shoulder P/AA/AROM (no UBE); gentle scapular and RTC strengthening; manual therapy & modalities PRN    PT Home Exercise Plan Access Code: [email or web address] (6/22), [email or web address](7/11), [email or web address]  (  7/18)    Consulted and Agree with Plan of Care Patient             Patient will benefit from skilled therapeutic intervention in order to improve the following deficits and impairments:  Decreased activity tolerance, Decreased endurance, Decreased knowledge of precautions, Decreased range of motion, Decreased safety awareness, Decreased scar mobility, Decreased strength, Increased fascial restricitons, Increased muscle spasms, Impaired perceived functional ability, Impaired flexibility, Impaired UE functional use, Improper body mechanics, Postural dysfunction, Pain  Visit Diagnosis: Stiffness of right shoulder, not elsewhere classified  Acute pain of right shoulder  Abnormal posture  Muscle weakness (generalized)  Localized edema     Problem List Patient Active Problem List   Diagnosis Date Noted   Shoulder subluxation, right, initial encounter 01/05/2021   Traumatic incomplete tear of right rotator cuff 01/05/2021   SVD (spontaneous vaginal delivery) 01/22/2013   Postpartum care following vaginal delivery (5/6) 01/22/2013    Artist Pais, PTA 04/14/2021, 3:21 PM  [facility] [address]  [place], [place], [address] Phone: [phone]   Fax:  [phone]  Name: ILAMAE GENG MRN: [id number] Date of Birth: 02/11/1976

## 2021-04-15 ENCOUNTER — Ambulatory Visit: Payer: 59

## 2021-04-15 DIAGNOSIS — R600 Localized edema: Secondary | ICD-10-CM

## 2021-04-15 DIAGNOSIS — M6281 Muscle weakness (generalized): Secondary | ICD-10-CM

## 2021-04-15 DIAGNOSIS — M25511 Pain in right shoulder: Secondary | ICD-10-CM

## 2021-04-15 DIAGNOSIS — M25611 Stiffness of right shoulder, not elsewhere classified: Secondary | ICD-10-CM | POA: Diagnosis not present

## 2021-04-15 DIAGNOSIS — R293 Abnormal posture: Secondary | ICD-10-CM | POA: Diagnosis not present

## 2021-04-15 NOTE — Therapy (Addendum)
PHYSICAL THERAPY DISCHARGE SUMMARY (06/29/2021)  Visits from Start of Care: 9  Current functional level related to goals / functional outcomes: See note below, improved R shoulder ROM and pain   Remaining deficits: See note below, weakness and tightness into IR   Education / Equipment: HEP  Plan: Patient did not return after 04/15/2021 visit and is being discharged.  She would require new referral to return to physical therapy.        Rennie Natter, PT, DPT   [facility] [address]  [place], [place], [address] Phone: [phone]   Fax:  [phone]  Physical Therapy Treatment  Patient Details  Name: Vanessa [name] MRN: [id number] Date of Birth: 10-14-1975 Referring Provider (PT): Melrose Nakayama, MD   Encounter Date: 04/15/2021   PT End of Session - 04/15/21 1659     Visit Number 9    Number of Visits 17    Date for PT Re-Evaluation 05/05/21    Authorization Type [facility]    PT Start Time 1618    PT Stop Time 1709    PT Time Calculation (min) 51 min    Activity Tolerance Patient tolerated treatment well    Behavior During Therapy [facility] for tasks assessed/performed             Past Medical History:  Diagnosis Date   Abnormal Pap smear    AMA (advanced maternal age) multigravida 35+    Gestational diabetes    Hx of varicella     Past Surgical History:  Procedure Laterality Date   AUGMENTATION MAMMAPLASTY Bilateral    BACK SURGERY     BREAST ENHANCEMENT SURGERY     LEEP     NASAL SEPTOPLASTY W/ TURBINOPLASTY Bilateral 03/08/2016   Procedure: BILATERAL NASAL SEPTOPLASTY WITH TURBINATE REDUCTION;  Surgeon: Rozetta Nunnery, MD;  Location: [place];  Service: ENT;  Laterality: Bilateral;   NASAL SINUS SURGERY Bilateral 03/08/2016   Procedure: TOTAL BILATERAL ETHMOIDECTOMY AND MAXILLARY OSTEA ENLARGEMENT;  Surgeon: Rozetta Nunnery, MD;  Location: [place];   Service: ENT;  Laterality: Bilateral;    There were no vitals filed for this visit.   Subjective Assessment - 04/15/21 1619     Subjective Was able to fast walk 4 miles yesterday.    Pertinent History 01/29/21 - R RTC repair    Patient Stated Goals "to be able run a Marathon in Oct and get back to swimming"    Currently in Pain? No/denies                [facility] PT Assessment - 04/15/21 0001       AROM   Right Shoulder Flexion 163 Degrees    Right Shoulder ABduction 157 Degrees    Right Shoulder Internal Rotation --   FIR to T8   Right Shoulder External Rotation --   FER to T4                          [facility] Adult PT Treatment/Exercise - 04/15/21 0001       Shoulder Exercises: Standing   Flexion Strengthening;Right;10 reps;Theraband    Theraband Level (Shoulder Flexion) Level 1 (Yellow)    ABduction Strengthening;Right;10 reps;Theraband    Theraband Level (Shoulder ABduction) Level 1 (Yellow)    Extension Strengthening;Both;10 reps;Theraband    Theraband Level (Shoulder Extension) Level 2 (Red)    Row Strengthening;Both;10 reps;Theraband  Theraband Level (Shoulder Row) Level 2 (Red)    Diagonals Strengthening;Right;10 reps;Theraband    Theraband Level (Shoulder Diagonals) Level 1 (Yellow)    Diagonals Limitations D2 flexion      Shoulder Exercises: Pulleys   Flexion 3 minutes    ABduction 3 minutes      Vasopneumatic   Number Minutes Vasopneumatic  10 minutes    Vasopnuematic Location  Shoulder    Vasopneumatic Pressure Low    Vasopneumatic Temperature  34                      PT Short Term Goals - 04/05/21 1617       PT SHORT TERM GOAL #1   Title Patient will be independent with initial HEP    Status Achieved   03/24/21   Target Date 03/24/21      PT SHORT TERM GOAL #2   Title Patient to improve R shoulder PROM to [facility] without pain provocation    Status Achieved   04/05/21   Target Date 04/07/21               PT  Long Term Goals - 04/15/21 1626       PT LONG TERM GOAL #1   Title Patient will be independent with ongoing/advanced HEP for self-management at home    Status On-going      PT LONG TERM GOAL #2   Title Improve posture and alignment with patient to demonstrate improved upright posture with posterior shoulder girdle engaged    Status On-going      PT LONG TERM GOAL #3   Title Patient to improve R shoulder AROM to WFL/WNL without pain provocation    Status Partially Met   much improvement with R shoulder AROM no pain, only tightness with IR     PT LONG TERM GOAL #4   Title Patient will demonstrate improved R shoulder strength to >/= 4/5 for functional UE use    Status On-going      PT LONG TERM GOAL #5   Title Patient to report ability to perform ADLs, household, and work-related tasks without limitation due to R shoulder pain, LOM or weakness    Status Partially Met   only limitation is from weakness     PT LONG TERM GOAL #6   Title Patient will be able to resume light jogging/running as part of her marathon training w/o limitation due to R shoulder    Status On-going                   Plan - 04/15/21 1700     Clinical Impression Statement Pt shows improvements with R shoulder AROM, she is not limited by pain with any movements but notes tightness with IR. She noted that she has no reports of limitation from ROM with ADLs but more due to weakness. We started more light OH strengthening today due to her reporting weakness with pulling and reaching OH with weighted objects. Also added these exercises to HEP, pt declined pictures. We spent time also talking about posture keeping her shoulders depressed and slightly retracted at rest and during activity to promote better GH joint mechanics. She is showing good progress toward goals.    Personal Factors and Comorbidities Comorbidity 2;Past/Current Experience;Time since onset of injury/illness/exacerbation    Comorbidities Cervical  spondylosis with fusion 2013; Augmentation mammaplasty surgery    PT Frequency 2x / week    PT Duration 8 weeks  PT Treatment/Interventions ADLs/Self Care Home Management;Cryotherapy;Electrical Stimulation;Iontophoresis 84m/ml Dexamethasone;Moist Heat;Ultrasound;Functional mobility training;Therapeutic activities;Therapeutic exercise;Neuromuscular re-education;Patient/family education;Manual techniques;Scar mobilization;Passive range of motion;Dry needling;Taping;Vasopneumatic Device;Joint Manipulations    PT Next Visit Plan progress R shoulder P/AA/AROM (no UBE); gentle scapular and RTC strengthening; manual therapy & modalities PRN    PT Home Exercise Plan Access Code: [email or web address] (6/22), [email or web address](7/11), [email or web address]  (7/18)    Consulted and Agree with Plan of Care Patient             Patient will benefit from skilled therapeutic intervention in order to improve the following deficits and impairments:  Decreased activity tolerance, Decreased endurance, Decreased knowledge of precautions, Decreased range of motion, Decreased safety awareness, Decreased scar mobility, Decreased strength, Increased fascial restricitons, Increased muscle spasms, Impaired perceived functional ability, Impaired flexibility, Impaired UE functional use, Improper body mechanics, Postural dysfunction, Pain  Visit Diagnosis: Stiffness of right shoulder, not elsewhere classified  Acute pain of right shoulder  Abnormal posture  Muscle weakness (generalized)  Localized edema     Problem List Patient Active Problem List   Diagnosis Date Noted   Shoulder subluxation, right, initial encounter 01/05/2021   Traumatic incomplete tear of right rotator cuff 01/05/2021   SVD (spontaneous vaginal delivery) 01/22/2013   Postpartum care following vaginal delivery (5/6) 01/22/2013    BArtist Pais PTA 04/15/2021, 6:16 PM  [facility] [place] [address]Phone: [phone]  Fax:  [phone] Name: Vanessa KUZNICKIMRN: [id number] of Birth: [birth year]

## 2021-04-30 ENCOUNTER — Ambulatory Visit: Payer: 59 | Admitting: Allergy

## 2021-05-13 DIAGNOSIS — Z09 Encounter for follow-up examination after completed treatment for conditions other than malignant neoplasm: Secondary | ICD-10-CM | POA: Diagnosis not present

## 2021-05-13 DIAGNOSIS — M542 Cervicalgia: Secondary | ICD-10-CM | POA: Diagnosis not present

## 2021-05-13 DIAGNOSIS — R200 Anesthesia of skin: Secondary | ICD-10-CM | POA: Diagnosis not present

## 2021-06-10 DIAGNOSIS — H5203 Hypermetropia, bilateral: Secondary | ICD-10-CM | POA: Diagnosis not present

## 2021-07-13 ENCOUNTER — Other Ambulatory Visit (HOSPITAL_BASED_OUTPATIENT_CLINIC_OR_DEPARTMENT_OTHER): Payer: Self-pay

## 2021-07-13 MED FILL — Fluoxetine HCl Cap 40 MG: ORAL | 90 days supply | Qty: 90 | Fill #1 | Status: AC

## 2021-07-14 ENCOUNTER — Other Ambulatory Visit (HOSPITAL_BASED_OUTPATIENT_CLINIC_OR_DEPARTMENT_OTHER): Payer: Self-pay

## 2021-07-14 MED ORDER — LEVONORGEST-ETH ESTRAD 91-DAY 0.15-0.03 MG PO TABS
1.0000 | ORAL_TABLET | Freq: Every day | ORAL | 0 refills | Status: DC
  Filled 2021-07-14: qty 91, 91d supply, fill #0

## 2021-08-02 ENCOUNTER — Telehealth: Payer: 59 | Admitting: Physician Assistant

## 2021-08-02 ENCOUNTER — Other Ambulatory Visit (HOSPITAL_BASED_OUTPATIENT_CLINIC_OR_DEPARTMENT_OTHER): Payer: Self-pay

## 2021-08-02 DIAGNOSIS — J020 Streptococcal pharyngitis: Secondary | ICD-10-CM

## 2021-08-02 MED ORDER — AZITHROMYCIN 250 MG PO TABS
ORAL_TABLET | ORAL | 0 refills | Status: DC
  Filled 2021-08-02: qty 6, 5d supply, fill #0

## 2021-08-02 NOTE — Progress Notes (Signed)
E-Visit for Sore Throat - Strep Symptoms  We are sorry that you are not feeling well.  Here is how we plan to help!  Based on what you have shared with me it is likely that you have strep pharyngitis.  Strep pharyngitis is inflammation and infection in the back of the throat.  This is an infection cause by bacteria and is treated with antibiotics.  I have prescribed Azithromycin 250 mg two tablets today and then one daily for 4 additional days. For throat pain, we recommend over the counter oral pain relief medications such as acetaminophen or aspirin, or anti-inflammatory medications such as ibuprofen or naproxen sodium. Topical treatments such as oral throat lozenges or sprays may be used as needed. Strep infections are not as easily transmitted as other respiratory infections, however we still recommend that you avoid close contact with loved ones, especially the very young and elderly.  Remember to wash your hands thoroughly throughout the day as this is the number one way to prevent the spread of infection and wipe down door knobs and counters with disinfectant.   Home Care: Only take medications as instructed by your medical team. Complete the entire course of an antibiotic. Do not take these medications with alcohol. A steam or ultrasonic humidifier can help congestion.  You can place a towel over your head and breathe in the steam from hot water coming from a faucet. Avoid close contacts especially the very young and the elderly. Cover your mouth when you cough or sneeze. Always remember to wash your hands.  Get Help Right Away If: You develop worsening fever or sinus pain. You develop a severe head ache or visual changes. Your symptoms persist after you have completed your treatment plan.  Make sure you Understand these instructions. Will watch your condition. Will get help right away if you are not doing well or get worse.   Thank you for choosing an e-visit.  Your e-visit  answers were reviewed by a board certified advanced clinical practitioner to complete your personal care plan. Depending upon the condition, your plan could have included both over the counter or prescription medications.  Please review your pharmacy choice. Make sure the pharmacy is open so you can pick up prescription now. If there is a problem, you may contact your provider through MyChart messaging and have the prescription routed to another pharmacy.  Your safety is important to us. If you have drug allergies check your prescription carefully.   For the next 24 hours you can use MyChart to ask questions about today's visit, request a non-urgent call back, or ask for a work or school excuse. You will get an email in the next two days asking about your experience. I hope that your e-visit has been valuable and will speed your recovery.  I provided 5 minutes of non face-to-face time during this encounter for chart review and documentation.   

## 2021-08-04 DIAGNOSIS — Z6822 Body mass index (BMI) 22.0-22.9, adult: Secondary | ICD-10-CM | POA: Diagnosis not present

## 2021-08-04 DIAGNOSIS — Z01419 Encounter for gynecological examination (general) (routine) without abnormal findings: Secondary | ICD-10-CM | POA: Diagnosis not present

## 2021-08-04 DIAGNOSIS — Z1231 Encounter for screening mammogram for malignant neoplasm of breast: Secondary | ICD-10-CM | POA: Diagnosis not present

## 2021-08-04 LAB — HM MAMMOGRAPHY

## 2021-08-05 ENCOUNTER — Other Ambulatory Visit (HOSPITAL_BASED_OUTPATIENT_CLINIC_OR_DEPARTMENT_OTHER): Payer: Self-pay

## 2021-08-05 MED ORDER — LEVONORGEST-ETH ESTRAD 91-DAY 0.15-0.03 MG PO TABS
1.0000 | ORAL_TABLET | Freq: Every day | ORAL | 4 refills | Status: DC
  Filled 2021-08-05 – 2021-10-26 (×2): qty 91, 91d supply, fill #0

## 2021-08-05 MED ORDER — FLUOXETINE HCL 40 MG PO CAPS
40.0000 mg | ORAL_CAPSULE | Freq: Every day | ORAL | 3 refills | Status: DC
  Filled 2021-08-05: qty 90, 90d supply, fill #0

## 2021-09-29 ENCOUNTER — Other Ambulatory Visit (HOSPITAL_BASED_OUTPATIENT_CLINIC_OR_DEPARTMENT_OTHER): Payer: Self-pay

## 2021-09-29 MED ORDER — ESCITALOPRAM OXALATE 10 MG PO TABS
10.0000 mg | ORAL_TABLET | Freq: Every day | ORAL | 0 refills | Status: DC
  Filled 2021-09-29: qty 30, 30d supply, fill #0

## 2021-10-01 IMAGING — MG DIGITAL SCREENING BREAST BILAT IMPLANT W/ TOMO W/ CAD
9 of 12 series · 9 of 28 positions shown · non-contrast
Comparison: Previous exam(s).

CLINICAL DATA: Screening.

EXAM:
DIGITAL SCREENING BILATERAL MAMMOGRAM WITH IMPLANTS, CAD AND TOMO
The patient has retropectoral implants. Standard and implant
displaced views were performed.

[R CC]
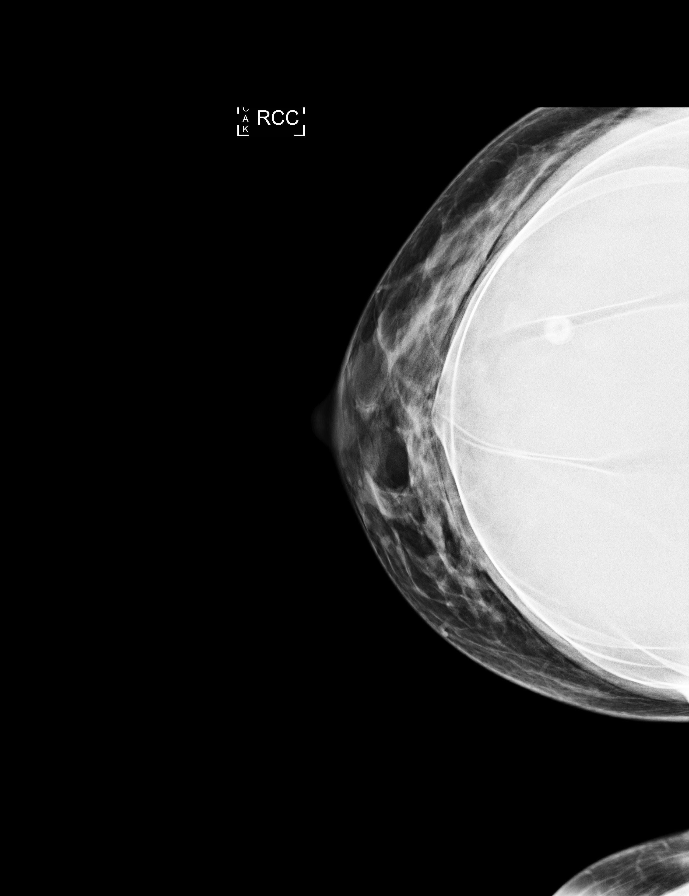

[R MLO]
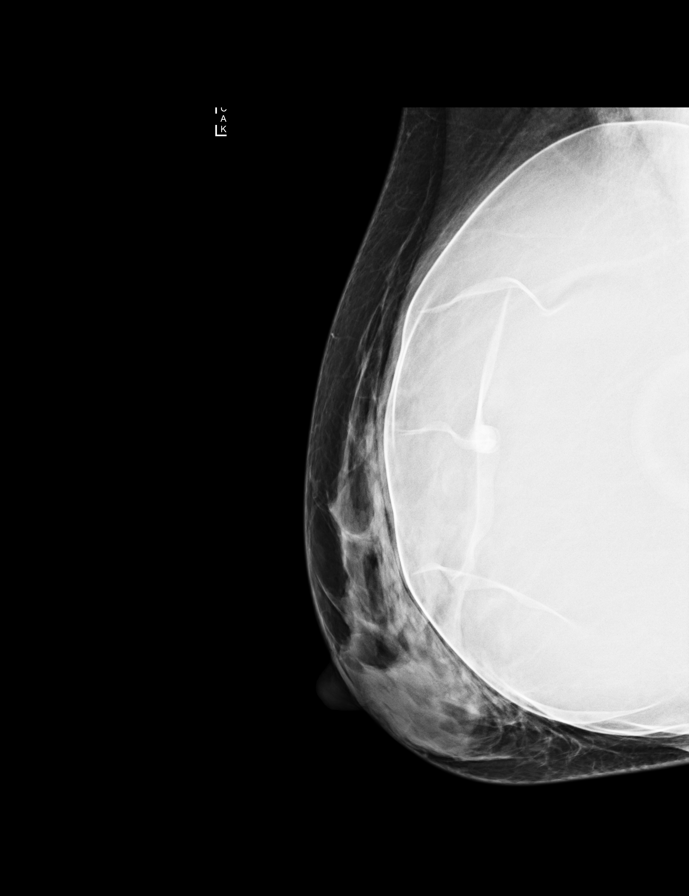

[L CC]
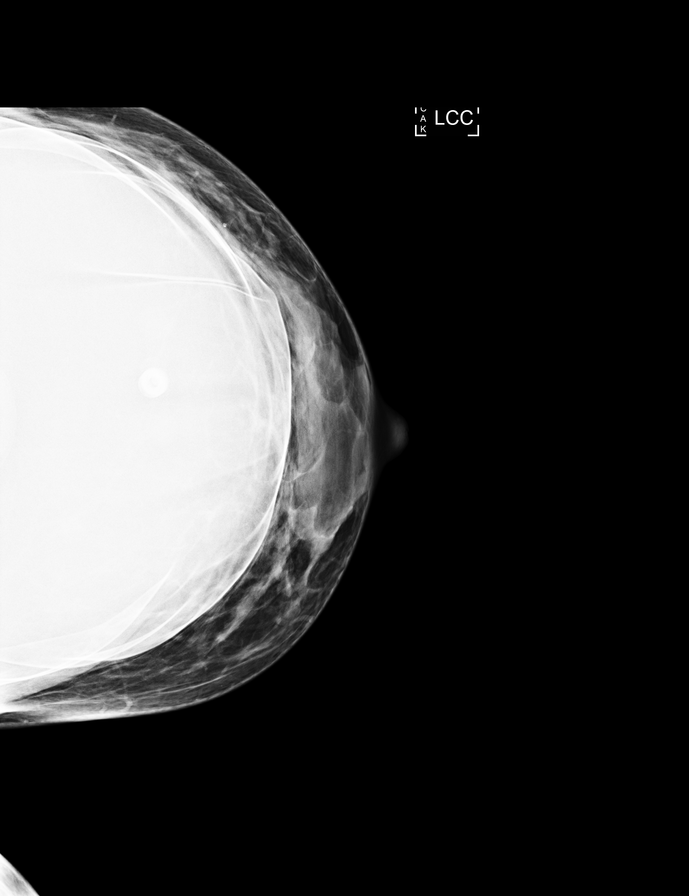

[L MLO]
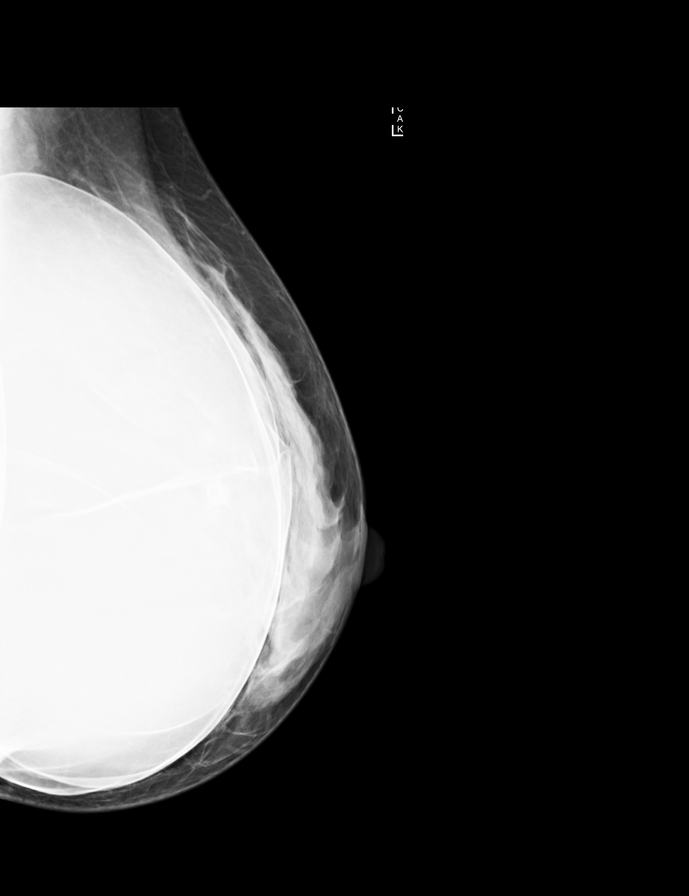

[R MLO synth-2D]
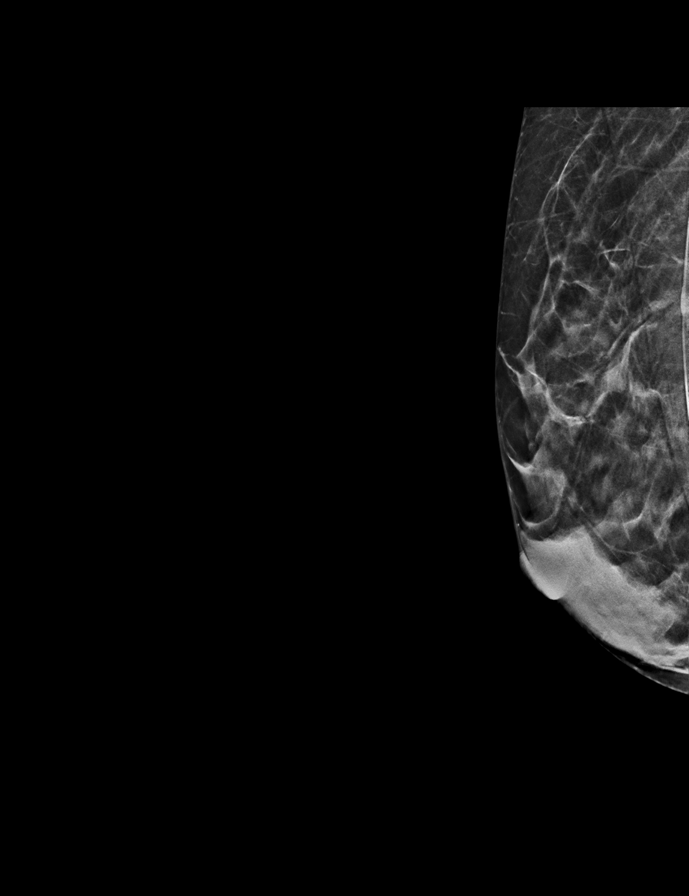

[L MLO synth-2D]
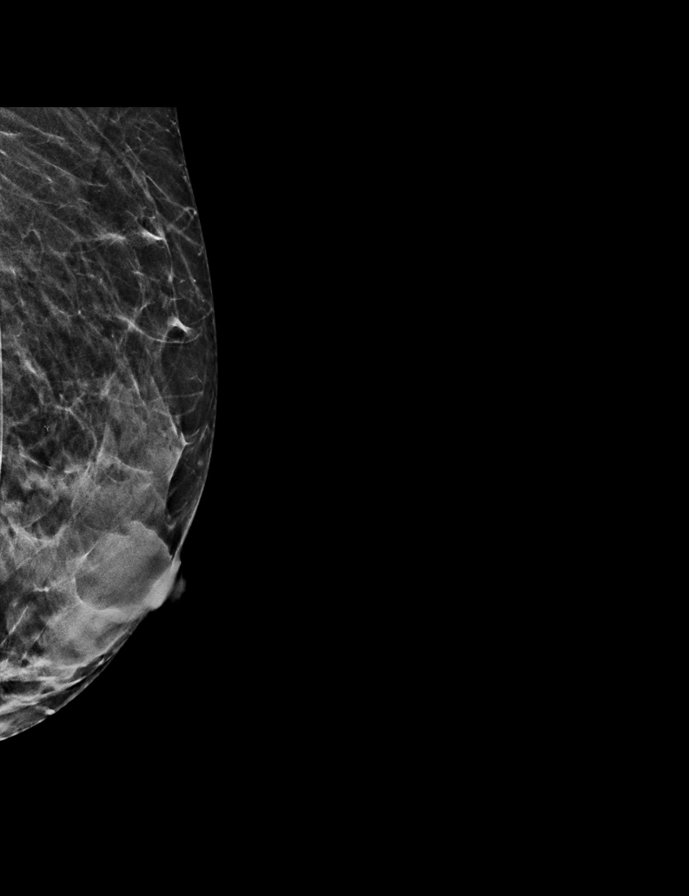

[R CC synth-2D]
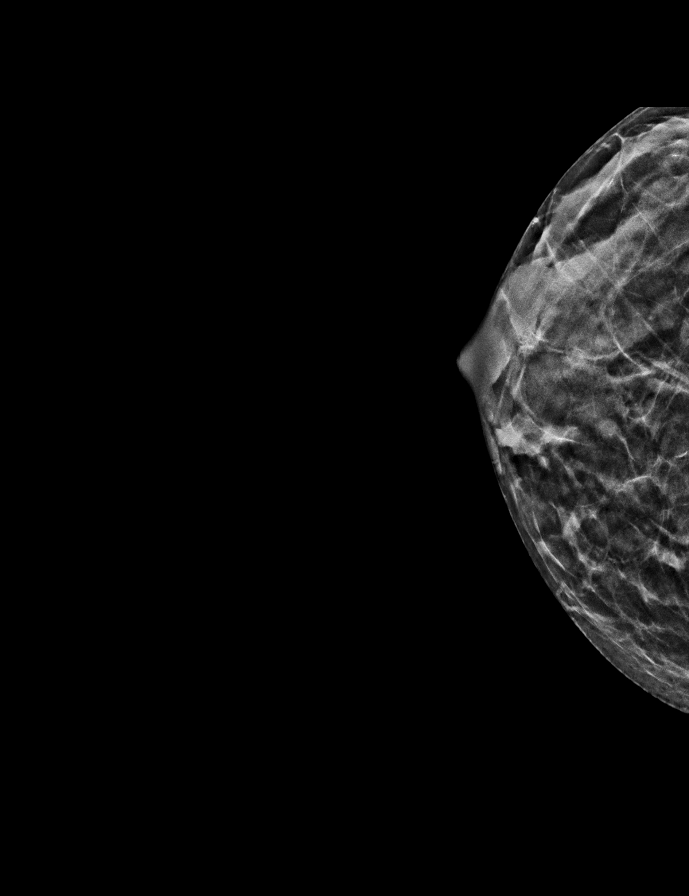

[L CC synth-2D]
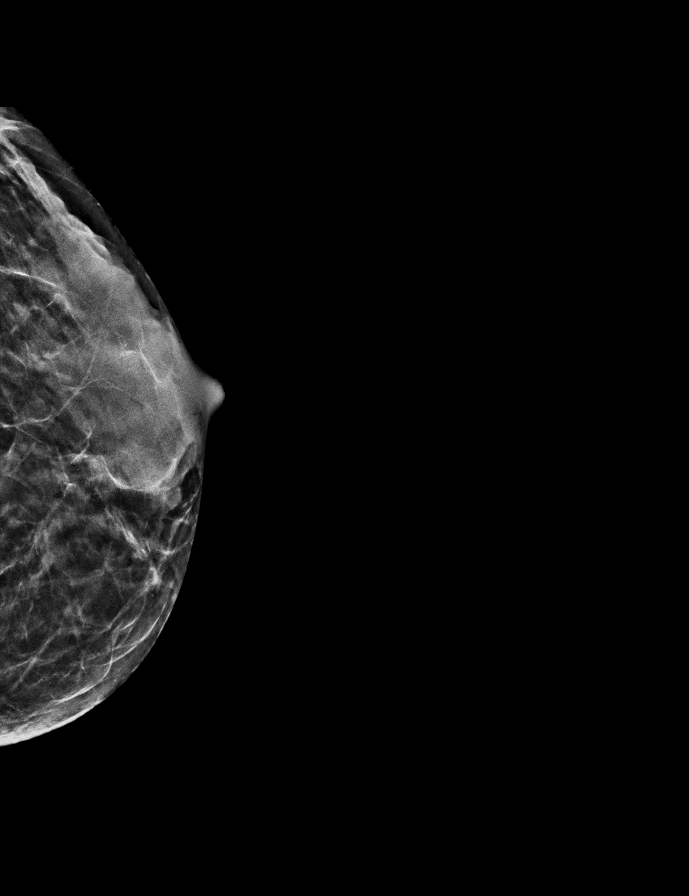

[R CCID BREAST TOMOSYNTHESIS IMAGE tomo · tomo slice 19/37.0]
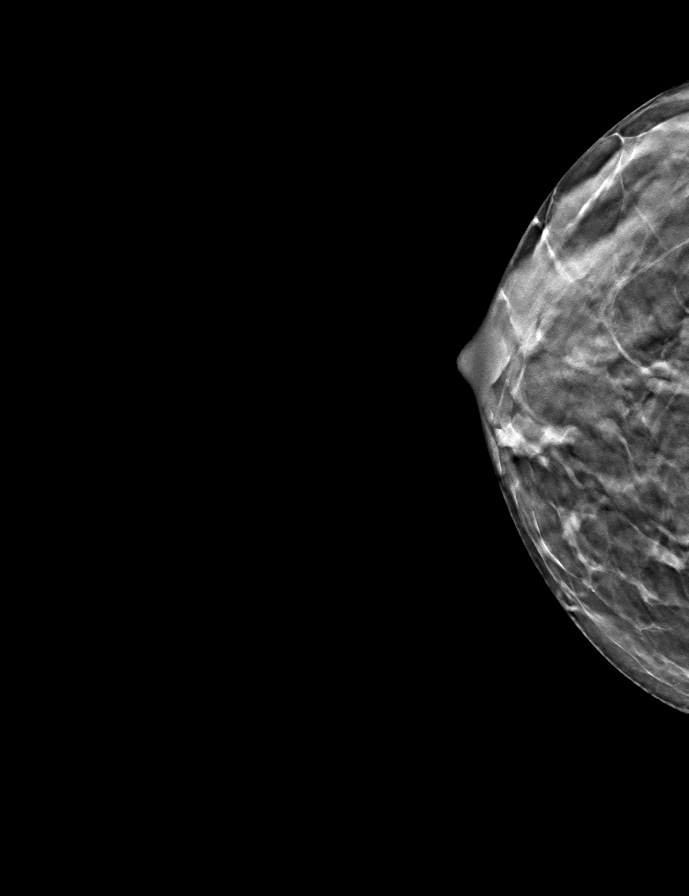

[9 of 28 positions shown; findings below may reference images not displayed]

ACR Breast Density Category c: The breast tissue is heterogeneously
dense, which may obscure small masses.
FINDINGS: There are no findings suspicious for malignancy. Images were
processed with CAD.
IMPRESSION: No mammographic evidence of malignancy. A result letter of this
screening mammogram will be mailed directly to the patient.

RECOMMENDATION:
Screening mammogram in one year. (Code:49-X-OQ9)

BI-RADS CATEGORY  1:  Negative.

## 2021-10-06 DIAGNOSIS — F411 Generalized anxiety disorder: Secondary | ICD-10-CM | POA: Diagnosis not present

## 2021-10-11 DIAGNOSIS — F411 Generalized anxiety disorder: Secondary | ICD-10-CM | POA: Diagnosis not present

## 2021-10-12 ENCOUNTER — Other Ambulatory Visit (HOSPITAL_BASED_OUTPATIENT_CLINIC_OR_DEPARTMENT_OTHER): Payer: Self-pay

## 2021-10-12 MED ORDER — ESCITALOPRAM OXALATE 10 MG PO TABS
10.0000 mg | ORAL_TABLET | Freq: Every day | ORAL | 0 refills | Status: DC
  Filled 2021-10-12 – 2021-10-26 (×2): qty 30, 30d supply, fill #0

## 2021-10-13 ENCOUNTER — Other Ambulatory Visit (HOSPITAL_BASED_OUTPATIENT_CLINIC_OR_DEPARTMENT_OTHER): Payer: Self-pay

## 2021-10-13 MED ORDER — ESCITALOPRAM OXALATE 10 MG PO TABS: 10.0000 mg | ORAL_TABLET | Freq: Every day | ORAL | 0 refills | Status: DC

## 2021-10-20 DIAGNOSIS — F411 Generalized anxiety disorder: Secondary | ICD-10-CM | POA: Diagnosis not present

## 2021-10-26 ENCOUNTER — Other Ambulatory Visit (HOSPITAL_BASED_OUTPATIENT_CLINIC_OR_DEPARTMENT_OTHER): Payer: Self-pay

## 2021-10-26 DIAGNOSIS — F32A Depression, unspecified: Secondary | ICD-10-CM | POA: Diagnosis not present

## 2021-10-26 MED ORDER — ESCITALOPRAM OXALATE 10 MG PO TABS
ORAL_TABLET | ORAL | 1 refills | Status: DC
  Filled 2021-11-30: qty 90, 90d supply, fill #0

## 2021-10-28 DIAGNOSIS — F411 Generalized anxiety disorder: Secondary | ICD-10-CM | POA: Diagnosis not present

## 2021-11-03 DIAGNOSIS — F411 Generalized anxiety disorder: Secondary | ICD-10-CM | POA: Diagnosis not present

## 2021-11-29 ENCOUNTER — Other Ambulatory Visit (HOSPITAL_BASED_OUTPATIENT_CLINIC_OR_DEPARTMENT_OTHER): Payer: Self-pay

## 2021-11-30 ENCOUNTER — Other Ambulatory Visit (HOSPITAL_BASED_OUTPATIENT_CLINIC_OR_DEPARTMENT_OTHER): Payer: Self-pay

## 2021-12-23 ENCOUNTER — Other Ambulatory Visit (HOSPITAL_BASED_OUTPATIENT_CLINIC_OR_DEPARTMENT_OTHER): Payer: Self-pay

## 2021-12-23 ENCOUNTER — Encounter: Payer: Self-pay | Admitting: Family Medicine

## 2021-12-23 ENCOUNTER — Ambulatory Visit (INDEPENDENT_AMBULATORY_CARE_PROVIDER_SITE_OTHER): Payer: 59 | Admitting: Family Medicine

## 2021-12-23 VITALS — BP 120/66 | HR 66 | Ht 66.0 in | Wt 138.6 lb

## 2021-12-23 DIAGNOSIS — Z3041 Encounter for surveillance of contraceptive pills: Secondary | ICD-10-CM | POA: Diagnosis not present

## 2021-12-23 DIAGNOSIS — Z1211 Encounter for screening for malignant neoplasm of colon: Secondary | ICD-10-CM | POA: Diagnosis not present

## 2021-12-23 DIAGNOSIS — F32A Depression, unspecified: Secondary | ICD-10-CM | POA: Diagnosis not present

## 2021-12-23 DIAGNOSIS — F419 Anxiety disorder, unspecified: Secondary | ICD-10-CM

## 2021-12-23 MED ORDER — FLUOXETINE HCL 20 MG PO TABS
20.0000 mg | ORAL_TABLET | Freq: Every day | ORAL | 1 refills | Status: DC
  Filled 2021-12-23: qty 90, 90d supply, fill #0

## 2021-12-23 MED ORDER — SETLAKIN 0.15-0.03 MG PO TABS
1.0000 | ORAL_TABLET | Freq: Every day | ORAL | 1 refills | Status: DC
  Filled 2021-12-23 – 2022-02-01 (×2): qty 91, 91d supply, fill #0

## 2021-12-23 MED ORDER — FLUOXETINE HCL 20 MG PO CAPS
20.0000 mg | ORAL_CAPSULE | Freq: Every day | ORAL | 3 refills | Status: DC
  Filled 2021-12-23: qty 90, 90d supply, fill #0
  Filled 2022-03-28: qty 90, 90d supply, fill #1
  Filled 2022-07-27: qty 90, 90d supply, fill #2

## 2021-12-23 NOTE — Progress Notes (Signed)
? ?______________________________________________________________________ ? ?HPI ?Vanessa [name] is a 46 y.o. female presenting to [facility] at [facility] today to establish care. Former PCP is leaving the practice so she needs a new provider and our location is convenient as she works in the same building.  ? ?Patient Care Team: ?Terrilyn Saver, NP as PCP - General (Family Medicine) ? ?Health Maintenance  ?Topic Date Due  ? Hepatitis C Screening: USPSTF Recommendation to screen - Ages 78-79 yo.  Never done  ? Tetanus Vaccine  Never done  ? Pap Smear  Never done  ? COVID-19 Vaccine (4 - Booster for Bowdle series) 07/21/2020  ? Colon Cancer Screening  Never done  ? Flu Shot  04/19/2022  ? HIV Screening  Completed  ? HPV Vaccine  Aged Out  ? ? ? ?Concerns today:  ?Depression/Anxiety - Patient reports that she had been on Prozac for several years. A year or two ago, life got hectic and her Prozac was increased to 40 mg to help her managed. States she started feeling like a "zombie" though previously the Prozac had been working really well and she was not having any side effects. She spoke to her OBGYN about this and they suggested that she stop the Prozac and changes to Lexapro. States she has now been on the Lexapro for at least 3 months and just feels like it is making her very irritable/angry and not helping her anxiety/depression. She did up going to one therapy session, but copay is going to be too expensive to continue. No SI/HI. ? ? ? ? ?Patient Active Problem List  ? Diagnosis Date Noted  ? Anxiety and depression 12/23/2021  ? Shoulder subluxation, right, initial encounter 01/05/2021  ? Traumatic incomplete tear of right rotator cuff 01/05/2021  ? SVD (spontaneous vaginal delivery) 01/22/2013  ? Postpartum care following vaginal delivery (5/6) 01/22/2013  ? ? ?PHQ9 Today: ? ?  12/23/2021  ?  2:48 PM  ?Depression screen PHQ 2/9  ?Decreased Interest 2  ?Down, Depressed, Hopeless 0  ?PHQ - 2  Score 2  ?Altered sleeping 0  ?Tired, decreased energy 2  ?Change in appetite 2  ?Feeling bad or failure about yourself  1  ?Trouble concentrating 1  ?Moving slowly or fidgety/restless 1  ?Suicidal thoughts 0  ?PHQ-9 Score 9  ?Difficult doing work/chores Somewhat difficult  ? ?GAD7 Today: ? ?  12/23/2021  ?  2:49 PM  ?GAD 7 : Generalized Anxiety Score  ?Nervous, Anxious, on Edge 3  ?Control/stop worrying 1  ?Worry too much - different things 1  ?Trouble relaxing 3  ?Restless 1  ?Easily annoyed or irritable 3  ?Afraid - awful might happen 0  ?Total GAD 7 Score 12  ?Anxiety Difficulty Somewhat difficult  ? ?______________________________________________________________________ ?PMH ?Past Medical History:  ?Diagnosis Date  ? Abnormal Pap smear   ? AMA (advanced maternal age) multigravida 43+   ? Gestational diabetes   ? Hx of varicella   ? ? ?ROS ?All review of systems negative except what is listed in the HPI ? ?PHYSICAL EXAM ?Physical Exam ?Vitals reviewed.  ?Constitutional:   ?   Appearance: Normal appearance.  ?HENT:  ?   Head: Normocephalic and atraumatic.  ?Cardiovascular:  ?   Rate and Rhythm: Normal rate and regular rhythm.  ?Pulmonary:  ?   Effort: Pulmonary effort is normal.  ?   Breath sounds: Normal breath sounds.  ?Skin: ?   General: Skin is warm and dry.  ?Neurological:  ?  General: No focal deficit present.  ?   Mental Status: She is alert and oriented to person, place, and time. Mental status is at baseline.  ?Psychiatric:     ?   Mood and Affect: Mood normal.     ?   Behavior: Behavior normal.     ?   Thought Content: Thought content normal.     ?   Judgment: Judgment normal.  ? ?______________________________________________________________________ ?ASSESSMENT AND PLAN ? ?1. Anxiety and depression ?She is not liking the Lexapro at all and was previously doing well on the lower dose of Prozac without any adverse effects. Would like to try switching back.  ?No SI/HI ?Start by cutting your Lexapro in half  for one week. Then stop the Lexapro and start the Prozac. You can start with a half tab for a week or two if you prefer and then start the full tablet.  ?Monitor for any side effects - education sheet attached.  ?Let's touch base again in about 6-8 weeks (virtual visit okay) to ensure mood is starting to improve. ?If you start having any worsening symptoms or thoughts of hurting yourself or others, stop the medication and notify us. If severe, go to the ED.  ? ?- FLUoxetine (PROZAC) 20 MG tablet; Take 1 tablet (20 mg total) by mouth daily.  Dispense: 90 tablet; Refill: 1 ? ?2. Encounter for surveillance of contraceptive pills ?Tolerating well. No side effects. No missed pills. Refill placed. She would like referral to change OBGYN for routine care. ?- SETLAKIN 0.15-0.03 MG tablet; Take 1 tablet by mouth daily. Skip placebos  Dispense: 91 tablet; Refill: 1 ?- Ambulatory referral to Obstetrics / Gynecology ? ?3. Screen for colon cancer ?- Ambulatory referral to Gastroenterology ? ? ?Establish care  ?Education provided today during visit and on AVS for patient to review at home.  ?Diet and Exercise recommendations provided.  ?Current diagnoses and recommendations discussed. ?HM recommendations reviewed with recommendations.  ? ? ?Outpatient Encounter Medications as of 12/23/2021  ?Medication Sig  ? FLUoxetine (PROZAC) 20 MG capsule Take 1 capsule (20 mg total) by mouth daily.  ? ibuprofen (ADVIL,MOTRIN) 200 MG tablet Take 200 mg by mouth every 6 (six) hours as needed.  ? [DISCONTINUED] FLUoxetine (PROZAC) 20 MG tablet Take 1 tablet (20 mg total) by mouth daily.  ? [DISCONTINUED] SETLAKIN 0.15-0.03 MG tablet 1 tablet daily.  ? levonorgestrel-ethinyl estradiol (SEASONALE) 0.15-0.03 MG tablet TAKE 1 TABLET BY MOUTH DAILY (Patient not taking: Reported on 03/10/2021)  ? SETLAKIN 0.15-0.03 MG tablet Take 1 tablet by mouth daily. Skip placebos  ? [DISCONTINUED] azithromycin (ZITHROMAX) 250 MG tablet Take 2 tablets by mouth on  day 1 then take 1 tablet daily thereafter until completed.  ? [DISCONTINUED] COVID-19 At Home Antigen Test (CARESTART COVID-19 HOME TEST) KIT use as directed  ? [DISCONTINUED] escitalopram (LEXAPRO) 10 MG tablet Take 1 tablet (10 mg total) by mouth daily.  ? [DISCONTINUED] escitalopram (LEXAPRO) 10 MG tablet Take 1 tablet (10 mg total) by mouth daily.  ? [DISCONTINUED] escitalopram (LEXAPRO) 10 MG tablet Take 1 tablet by mouth once daily  ? [DISCONTINUED] FLUoxetine (PROZAC) 10 MG capsule TAKE 3 CAPSULES BY MOUTH EVERY MORNING (Patient not taking: Reported on 03/10/2021)  ? [DISCONTINUED] FLUoxetine (PROZAC) 40 MG capsule TAKE 1 CAPSULE BY MOUTH EVERY MORNING  ? [DISCONTINUED] HYDROcodone-acetaminophen (NORCO/VICODIN) 5-325 MG tablet Take 1 tablet by mouth every 8 (eight) hours as needed. (Patient not taking: Reported on 03/10/2021)  ? [DISCONTINUED] levonorgestrel-ethinyl estradiol (JOLESSA) 0.15-0.03 MG  tablet Take 1 tablet by mouth daily.  ? [DISCONTINUED] mupirocin cream (BACTROBAN) 2 % Apply 1 application topically 2 (two) times daily. (Patient not taking: Reported on 03/10/2021)  ? ?No facility-administered encounter medications on file as of 12/23/2021.  ? ? ?Return for 6 week mood f/u (virtual okay); physical at your convenience . ? ?Please contact office for sooner follow-up if symptoms do not improve or worsen. Seek emergency care if symptoms become severe. ? ? ?Purcell Nails Olevia Bowens, DNP, FNP-C ? ? ?

## 2021-12-23 NOTE — Patient Instructions (Signed)
Start by cutting your Lexapro in half for one week. Then stop the Lexapro and start the Prozac. You can start with a half tab for the week or two if you prefer and then start the full tablet.  ?Monitor for any side effects - education sheet attached.  ?Let's touch base again in about 6-8 weeks (virtual visit) to ensure mood is starting to improve. ?If you start having any worsening symptoms or thoughts of hurting yourself or others, stop the medication and notify us. If severe, go to the ED.  ?

## 2021-12-23 NOTE — Assessment & Plan Note (Signed)
She is not liking the Lexapro at all and was previously doing well on the lower dose of Prozac without any adverse effects. Would like to try switching back.  ?No SI/HI ?Start by cutting your Lexapro in half for one week. Then stop the Lexapro and start the Prozac. You can start with a half tab for a week or two if you prefer and then start the full tablet.  ?Monitor for any side effects - education sheet attached.  ?Let's touch base again in about 6-8 weeks (virtual visit okay) to ensure mood is starting to improve. ?If you start having any worsening symptoms or thoughts of hurting yourself or others, stop the medication and notify us. If severe, go to the ED.  ?

## 2021-12-29 ENCOUNTER — Other Ambulatory Visit (HOSPITAL_BASED_OUTPATIENT_CLINIC_OR_DEPARTMENT_OTHER): Payer: Self-pay

## 2021-12-31 ENCOUNTER — Other Ambulatory Visit (HOSPITAL_BASED_OUTPATIENT_CLINIC_OR_DEPARTMENT_OTHER): Payer: Self-pay

## 2022-01-04 ENCOUNTER — Encounter: Payer: Self-pay | Admitting: *Deleted

## 2022-02-01 ENCOUNTER — Other Ambulatory Visit (HOSPITAL_BASED_OUTPATIENT_CLINIC_OR_DEPARTMENT_OTHER): Payer: Self-pay

## 2022-02-11 ENCOUNTER — Other Ambulatory Visit (HOSPITAL_BASED_OUTPATIENT_CLINIC_OR_DEPARTMENT_OTHER): Payer: Self-pay

## 2022-02-15 ENCOUNTER — Other Ambulatory Visit (HOSPITAL_BASED_OUTPATIENT_CLINIC_OR_DEPARTMENT_OTHER): Payer: Self-pay

## 2022-02-25 ENCOUNTER — Encounter: Payer: Self-pay | Admitting: Certified Nurse Midwife

## 2022-02-25 ENCOUNTER — Ambulatory Visit (INDEPENDENT_AMBULATORY_CARE_PROVIDER_SITE_OTHER): Payer: 59 | Admitting: Certified Nurse Midwife

## 2022-02-25 VITALS — BP 129/88 | HR 71 | Ht 66.0 in | Wt 133.0 lb

## 2022-02-25 DIAGNOSIS — R4586 Emotional lability: Secondary | ICD-10-CM | POA: Diagnosis not present

## 2022-02-25 DIAGNOSIS — N951 Menopausal and female climacteric states: Secondary | ICD-10-CM

## 2022-02-25 NOTE — Progress Notes (Unsigned)
Pt is not due for annual Up to date on pap and mammo

## 2022-02-28 ENCOUNTER — Encounter: Payer: Self-pay | Admitting: Certified Nurse Midwife

## 2022-02-28 NOTE — Progress Notes (Signed)
   GYNECOLOGY OFFICE VISIT NOTE  History:  46 y.o. G64P1001 female here today to establish care and concerned her hormones may be worsening her mood. She is currently using continuous OCPs with menses occurring ~q3 months. She is satisfied with this but would like to have no periods. She has been taking Prozac 20 mg for GAD and depression for several years. She has increased to 40 mg in the past but felt too out of it. She is concerned that her hormones are out of balance due to her age and approaching menopause. She denies hot flashes but reports waking up sweaty. She reports she doesn't have time for counseling. She works as a Scientist, research (medical) and has a 49 year old daughter. She is married and reports no sexual activity in 5 years.   Past Medical History:  Diagnosis Date   Abnormal Pap smear    AMA (advanced maternal age) multigravida 35+    Anxiety    Gestational diabetes    Hx of varicella     Past Surgical History:  Procedure Laterality Date   AUGMENTATION MAMMAPLASTY Bilateral    BACK SURGERY     BREAST ENHANCEMENT SURGERY     LEEP     NASAL SEPTOPLASTY W/ TURBINOPLASTY Bilateral 03/08/2016   Procedure: BILATERAL NASAL SEPTOPLASTY WITH TURBINATE REDUCTION;  Surgeon: Rozetta Nunnery, MD;  Location: [place];  Service: ENT;  Laterality: Bilateral;   NASAL SINUS SURGERY Bilateral 03/08/2016   Procedure: TOTAL BILATERAL ETHMOIDECTOMY AND MAXILLARY OSTEA ENLARGEMENT;  Surgeon: Rozetta Nunnery, MD;  Location: [place];  Service: ENT;  Laterality: Bilateral;    The following portions of the patient's history were reviewed and updated as appropriate: allergies, current medications, past family history, past medical history, past social history, past surgical history and problem list.   Health Maintenance:  Normal pap and negative HRHPV on 03/12/20.  completed.  Review of Systems:  Negative except noted in HPI  Objective:  Physical Exam BP  129/88   Pulse 71   Ht '5\' 6"'$  (1.676 m)   Wt 133 lb (60.3 kg)   LMP 02/11/2022   BMI 21.47 kg/m  CONSTITUTIONAL: Well-developed, well-nourished female in no acute distress.  HENT:  Normocephalic, atraumatic EYES: Conjunctivae and EOM are normal NECK: Normal range of motion NEUROLOGIC: Alert and oriented to person, place, and time PSYCHIATRIC: Normal mood and affect CARDIOVASCULAR: Normal heart rate noted RESPIRATORY: Effort and rate normal MUSCULOSKELETAL: Normal range of motion  Labs and Imaging No results found for this or any previous visit (from the past 24 hour(s)).  Assessment & Plan:   1. Mood disturbance   2. Perimenopausal    Orders Placed This Encounter  Procedures   TSH   CBC   Prolactin   FSH   Anti-Mullerian Hormone [facility]), Female   Informed patient there isn't a specific test for hormonal imbalance Recommend psychotherapy with medical management of sx, data shows better results  Follow up for annual or prn  I spent 20 minutes dedicated to the care of this patient including pre-visit review of records, face to face time with the patient discussing her conditions and treatments and post visit ordering of testing.  Julianne Handler, [place] 02/28/2022 12:33 PM

## 2022-03-02 ENCOUNTER — Other Ambulatory Visit (HOSPITAL_BASED_OUTPATIENT_CLINIC_OR_DEPARTMENT_OTHER): Payer: Self-pay

## 2022-03-02 ENCOUNTER — Ambulatory Visit (AMBULATORY_SURGERY_CENTER): Payer: 59 | Admitting: *Deleted

## 2022-03-02 ENCOUNTER — Encounter: Payer: Self-pay | Admitting: Certified Nurse Midwife

## 2022-03-02 VITALS — Ht 66.0 in | Wt 135.0 lb

## 2022-03-02 DIAGNOSIS — Z1211 Encounter for screening for malignant neoplasm of colon: Secondary | ICD-10-CM

## 2022-03-02 LAB — CBC
HCT: 40.4 % (ref 35.0–45.0)
Hemoglobin: 13.5 g/dL (ref 11.7–15.5)
MCH: 31.1 pg (ref 27.0–33.0)
MCHC: 33.4 g/dL (ref 32.0–36.0)
MCV: 93.1 fL (ref 80.0–100.0)
MPV: 10.4 fL (ref 7.5–12.5)
Platelets: 221 10*3/uL (ref 140–400)
RBC: 4.34 10*6/uL (ref 3.80–5.10)
RDW: 11.8 % (ref 11.0–15.0)
WBC: 4.5 10*3/uL (ref 3.8–10.8)

## 2022-03-02 LAB — ANTI-MULLERIAN HORMONE (AMH), FEMALE: Anti-Mullerian Hormones(AMH), Female: 0.01 ng/mL

## 2022-03-02 LAB — TSH: TSH: 1 mIU/L

## 2022-03-02 LAB — FOLLICLE STIMULATING HORMONE: FSH: 44.7 m[IU]/mL

## 2022-03-02 LAB — PROLACTIN: Prolactin: 4.6 ng/mL

## 2022-03-02 MED ORDER — ONDANSETRON HCL 4 MG PO TABS
ORAL_TABLET | ORAL | 0 refills | Status: DC
  Filled 2022-03-02: qty 2, 1d supply, fill #0

## 2022-03-02 MED ORDER — NA SULFATE-K SULFATE-MG SULF 17.5-3.13-1.6 GM/177ML PO SOLN
1.0000 | Freq: Once | ORAL | 0 refills | Status: AC
  Filled 2022-03-02: qty 354, 1d supply, fill #0

## 2022-03-02 NOTE — Progress Notes (Signed)
Pt's previsit is done over the phone and all paperwork (prep instructions, blank consent form to just read over) sent to patient via [place]. Pt's name and DOB verified at the beginning of the previsit.  Pt denies any difficulty with ambulating.   No trouble with anesthesia, denies being told they were difficult to intubate, or hx/fam hx of malignant hyperthermia per pt   No egg or soy allergy  No home oxygen use   No medications for weight loss taken  emmi information given  Pt denies constipation issues  Pt informed that we do not do prior authorizations for prep  Zofran sent to pharmacy

## 2022-03-03 ENCOUNTER — Other Ambulatory Visit (HOSPITAL_BASED_OUTPATIENT_CLINIC_OR_DEPARTMENT_OTHER): Payer: Self-pay

## 2022-03-10 ENCOUNTER — Other Ambulatory Visit (HOSPITAL_BASED_OUTPATIENT_CLINIC_OR_DEPARTMENT_OTHER): Payer: Self-pay

## 2022-03-16 DIAGNOSIS — F411 Generalized anxiety disorder: Secondary | ICD-10-CM | POA: Diagnosis not present

## 2022-03-23 ENCOUNTER — Encounter: Payer: Self-pay | Admitting: Gastroenterology

## 2022-03-28 ENCOUNTER — Other Ambulatory Visit (HOSPITAL_BASED_OUTPATIENT_CLINIC_OR_DEPARTMENT_OTHER): Payer: Self-pay

## 2022-03-30 ENCOUNTER — Encounter: Payer: 59 | Admitting: Gastroenterology

## 2022-03-30 DIAGNOSIS — F411 Generalized anxiety disorder: Secondary | ICD-10-CM | POA: Diagnosis not present

## 2022-04-25 ENCOUNTER — Ambulatory Visit: Payer: 59 | Admitting: Family Medicine

## 2022-04-25 DIAGNOSIS — M9901 Segmental and somatic dysfunction of cervical region: Secondary | ICD-10-CM | POA: Diagnosis not present

## 2022-04-25 DIAGNOSIS — M9902 Segmental and somatic dysfunction of thoracic region: Secondary | ICD-10-CM | POA: Diagnosis not present

## 2022-04-25 DIAGNOSIS — M9903 Segmental and somatic dysfunction of lumbar region: Secondary | ICD-10-CM | POA: Diagnosis not present

## 2022-04-25 DIAGNOSIS — M5386 Other specified dorsopathies, lumbar region: Secondary | ICD-10-CM | POA: Diagnosis not present

## 2022-04-25 DIAGNOSIS — M9904 Segmental and somatic dysfunction of sacral region: Secondary | ICD-10-CM | POA: Diagnosis not present

## 2022-05-02 ENCOUNTER — Other Ambulatory Visit: Payer: Self-pay | Admitting: Family Medicine

## 2022-05-02 ENCOUNTER — Other Ambulatory Visit (HOSPITAL_BASED_OUTPATIENT_CLINIC_OR_DEPARTMENT_OTHER): Payer: Self-pay

## 2022-05-02 DIAGNOSIS — Z3041 Encounter for surveillance of contraceptive pills: Secondary | ICD-10-CM

## 2022-05-02 MED ORDER — SETLAKIN 0.15-0.03 MG PO TABS
1.0000 | ORAL_TABLET | Freq: Every day | ORAL | 1 refills | Status: DC
  Filled 2022-05-02: qty 91, 84d supply, fill #0
  Filled 2022-07-27: qty 91, 84d supply, fill #1

## 2022-05-03 ENCOUNTER — Other Ambulatory Visit (HOSPITAL_BASED_OUTPATIENT_CLINIC_OR_DEPARTMENT_OTHER): Payer: Self-pay

## 2022-05-04 DIAGNOSIS — F411 Generalized anxiety disorder: Secondary | ICD-10-CM | POA: Diagnosis not present

## 2022-05-09 ENCOUNTER — Telehealth: Payer: Self-pay | Admitting: Gastroenterology

## 2022-05-09 NOTE — Telephone Encounter (Signed)
Patient called requested to cancel her procedure scheduled for 05/11/22. Said she does not have transportation and will not reschedule until she can get someone.

## 2022-05-11 ENCOUNTER — Encounter: Payer: 59 | Admitting: Gastroenterology

## 2022-05-20 ENCOUNTER — Other Ambulatory Visit (HOSPITAL_BASED_OUTPATIENT_CLINIC_OR_DEPARTMENT_OTHER): Payer: Self-pay

## 2022-05-20 ENCOUNTER — Encounter: Payer: Self-pay | Admitting: Family

## 2022-05-20 ENCOUNTER — Ambulatory Visit (INDEPENDENT_AMBULATORY_CARE_PROVIDER_SITE_OTHER): Payer: 59 | Admitting: Family

## 2022-05-20 ENCOUNTER — Other Ambulatory Visit (HOSPITAL_COMMUNITY)
Admission: RE | Admit: 2022-05-20 | Discharge: 2022-05-20 | Disposition: A | Discharge status: Home / Self Care | Payer: 59 | Source: Ambulatory Visit | Attending: Family | Admitting: Family

## 2022-05-20 VITALS — BP 100/60 | HR 67 | Temp 98.6°F | Ht 66.0 in | Wt 132.6 lb

## 2022-05-20 DIAGNOSIS — N949 Unspecified condition associated with female genital organs and menstrual cycle: Secondary | ICD-10-CM

## 2022-05-20 DIAGNOSIS — N9489 Other specified conditions associated with female genital organs and menstrual cycle: Secondary | ICD-10-CM

## 2022-05-20 LAB — POCT URINALYSIS DIP (MANUAL ENTRY)
Bilirubin, UA: NEGATIVE
Glucose, UA: NEGATIVE mg/dL
Ketones, POC UA: NEGATIVE mg/dL
Leukocytes, UA: NEGATIVE
Nitrite, UA: NEGATIVE
Protein Ur, POC: NEGATIVE mg/dL
Spec Grav, UA: 1.02 (ref 1.010–1.025)
Urobilinogen, UA: 0.2 E.U./dL
pH, UA: 5 (ref 5.0–8.0)

## 2022-05-20 MED ORDER — METRONIDAZOLE 500 MG PO TABS
500.0000 mg | ORAL_TABLET | Freq: Two times a day (BID) | ORAL | 0 refills | Status: AC
  Filled 2022-05-20: qty 14, 7d supply, fill #0

## 2022-05-20 MED ORDER — FLUCONAZOLE 150 MG PO TABS
ORAL_TABLET | ORAL | 0 refills | Status: DC
  Filled 2022-05-20: qty 2, 3d supply, fill #0

## 2022-05-20 NOTE — Progress Notes (Signed)
Vanessa [name] is a 46 y.o. female with the following history as recorded in EpicCare:  Patient Active Problem List   Diagnosis Date Noted   Anxiety and depression 12/23/2021   Shoulder subluxation, right, initial encounter 01/05/2021   Traumatic incomplete tear of right rotator cuff 01/05/2021   SVD (spontaneous vaginal delivery) 01/22/2013   Postpartum care following vaginal delivery (5/6) 01/22/2013    Current Outpatient Medications  Medication Sig Dispense Refill   fluconazole (DIFLUCAN) 150 MG tablet Take 1 tablet as directed; repeat after 72 hours 2 tablet 0   FLUoxetine (PROZAC) 20 MG capsule Take 1 capsule (20 mg total) by mouth daily. 90 capsule 3   ibuprofen (ADVIL,MOTRIN) 200 MG tablet Take 200 mg by mouth every 6 (six) hours as needed.     metroNIDAZOLE (FLAGYL) 500 MG tablet Take 1 tablet (500 mg total) by mouth 2 (two) times daily for 7 days. 14 tablet 0   Multiple Vitamin (MULTIVITAMIN ADULT PO) Take by mouth daily.     ondansetron (ZOFRAN) 4 MG tablet Take per colonoscopy instructions (Patient not taking: Reported on 05/20/2022) 2 tablet 0   SETLAKIN 0.15-0.03 MG tablet Take 1 tablet by mouth daily. Skip placebos (Patient not taking: Reported on 05/20/2022) 91 tablet 1   No current facility-administered medications for this visit.    Allergies: Penicillins, Sulfa antibiotics, and Misc. sulfonamide containing compounds  Past Medical History:  Diagnosis Date   Abnormal Pap smear    AMA (advanced maternal age) multigravida 35+    Anxiety    Gestational diabetes    Hx of varicella     Past Surgical History:  Procedure Laterality Date   AUGMENTATION MAMMAPLASTY Bilateral    BACK SURGERY     C5, C6   BREAST ENHANCEMENT SURGERY     LEEP     NASAL SEPTOPLASTY W/ TURBINOPLASTY Bilateral 03/08/2016   Procedure: BILATERAL NASAL SEPTOPLASTY WITH TURBINATE REDUCTION;  Surgeon: Rozetta Nunnery, MD;  Location: [place];  Service: ENT;  Laterality: Bilateral;    NASAL SINUS SURGERY Bilateral 03/08/2016   Procedure: TOTAL BILATERAL ETHMOIDECTOMY AND MAXILLARY OSTEA ENLARGEMENT;  Surgeon: Rozetta Nunnery, MD;  Location: [place];  Service: ENT;  Laterality: Bilateral;    Family History  Problem Relation Age of Onset   Rheum arthritis Mother    Anemia Mother    Migraines Mother    Diabetes Father    Hypertension Father    Diabetes Maternal Grandmother    Colon cancer Maternal Grandfather    Stroke Maternal Grandfather    Heart disease Maternal Grandfather    Esophageal cancer Paternal Grandfather    COPD Paternal Grandfather    Stomach cancer Neg Hx    Rectal cancer Neg Hx     Social History   Tobacco Use   Smoking status: Former    Types: Cigarettes    Quit date: 11/24/1996    Years since quitting: 25.5   Smokeless tobacco: Never  Substance Use Topics   Alcohol use: No    Subjective:   Vaginal burning x 2 weeks; some relief with OTC Monistat initially and then symptoms re-flared; no vaginal discharge;   LMP July 4 ( on OCPs- 3 month cycling)    Objective:  Vitals:   05/20/22 1432  BP: 100/60  Pulse: 67  Temp: 98.6 F (37 C)  TempSrc: Oral  SpO2: 99%  Weight: 132 lb 9.6 oz (60.1 kg)  Height: '5\' 6"'$  (1.676 m)    General:  Well developed, well nourished, in no acute distress  Skin : Warm and dry.  Head: Normocephalic and atraumatic  Lungs: Respirations unlabored;  Neurologic: Alert and oriented; speech intact; face symmetrical; moves all extremities well; CNII-XII intact without focal deficit   Assessment:  1. Vaginal burning     Plan:  Suspect BV; check U/A and urine culture; check vaginal swab;  Rx for Diflucan and Flagyl; follow up to be determined.   No follow-ups on file.  Orders Placed This Encounter  Procedures   Urine Culture   POCT urinalysis dipstick    Requested Prescriptions   Signed Prescriptions Disp Refills   fluconazole (DIFLUCAN) 150 MG tablet 2 tablet 0    Sig: Take 1  tablet as directed; repeat after 72 hours   metroNIDAZOLE (FLAGYL) 500 MG tablet 14 tablet 0    Sig: Take 1 tablet (500 mg total) by mouth 2 (two) times daily for 7 days.

## 2022-05-21 LAB — URINE CULTURE
MICRO NUMBER:: 13864515
Result:: NO GROWTH
SPECIMEN QUALITY:: ADEQUATE

## 2022-05-24 LAB — CERVICOVAGINAL ANCILLARY ONLY
Bacterial Vaginitis (gardnerella): NEGATIVE
Candida Glabrata: NEGATIVE
Candida Vaginitis: NEGATIVE
Chlamydia: NEGATIVE
Comment: NEGATIVE
Comment: NEGATIVE
Comment: NEGATIVE
Comment: NEGATIVE
Comment: NEGATIVE
Comment: NORMAL
Neisseria Gonorrhea: NEGATIVE
Trichomonas: NEGATIVE

## 2022-07-07 DIAGNOSIS — H5203 Hypermetropia, bilateral: Secondary | ICD-10-CM | POA: Diagnosis not present

## 2022-07-07 DIAGNOSIS — H524 Presbyopia: Secondary | ICD-10-CM | POA: Diagnosis not present

## 2022-07-07 DIAGNOSIS — H52223 Regular astigmatism, bilateral: Secondary | ICD-10-CM | POA: Diagnosis not present

## 2022-07-27 ENCOUNTER — Other Ambulatory Visit (HOSPITAL_BASED_OUTPATIENT_CLINIC_OR_DEPARTMENT_OTHER): Payer: Self-pay

## 2022-08-29 ENCOUNTER — Ambulatory Visit: Payer: 59 | Admitting: Family Medicine

## 2022-08-29 ENCOUNTER — Encounter: Payer: Self-pay | Admitting: Family Medicine

## 2022-08-29 ENCOUNTER — Other Ambulatory Visit (HOSPITAL_BASED_OUTPATIENT_CLINIC_OR_DEPARTMENT_OTHER): Payer: Self-pay

## 2022-08-29 VITALS — BP 120/82 | HR 87 | Temp 97.6°F | Resp 16 | Ht 66.0 in | Wt 131.0 lb

## 2022-08-29 DIAGNOSIS — F419 Anxiety disorder, unspecified: Secondary | ICD-10-CM | POA: Diagnosis not present

## 2022-08-29 DIAGNOSIS — F32A Depression, unspecified: Secondary | ICD-10-CM | POA: Diagnosis not present

## 2022-08-29 MED ORDER — BUPROPION HCL ER (XL) 150 MG PO TB24
150.0000 mg | ORAL_TABLET | Freq: Every day | ORAL | 3 refills | Status: DC
  Filled 2022-08-29: qty 30, 30d supply, fill #0
  Filled 2022-09-28 (×2): qty 30, 30d supply, fill #1

## 2022-08-29 NOTE — Assessment & Plan Note (Signed)
No SI/HI.  Changing to Wellbutrin today at her request to hopefully avoid decreased libido adverse effects of SSRIs.  Follow-up in 6 weeks or so or sooner if needed.

## 2022-08-29 NOTE — Progress Notes (Signed)
Established Patient Office Visit  Subjective   Patient ID: Vanessa [name], female    DOB: 02-13-1976  Age: 46 y.o. MRN: [id number]  Chief Complaint  Patient presents with   Medication Refill    Discuss medication      Patient is here for mood follow-up and medication changes. She has been doing well with anxiety/depression while on Prozac, but has had significant decreased libido. She is interested in changing to Wellbutrin to see if that will have less side effects but still help her mood. No SI/HI.        08/29/2022    2:53 PM 08/29/2022    1:45 PM 05/20/2022    2:34 PM  PHQ9 SCORE ONLY  PHQ-9 Total Score 4 0 0      08/29/2022    3:22 PM 08/29/2022    1:45 PM 12/23/2021    2:49 PM  GAD 7 : Generalized Anxiety Score  Nervous, Anxious, on Edge 2 0 3  Control/stop worrying 1 0 1  Worry too much - different things 1 0 1  Trouble relaxing 2 0 3  Restless 1 0 1  Easily annoyed or irritable 2 0 3  Afraid - awful might happen 0 0 0  Total GAD 7 Score 9 0 12  Anxiety Difficulty  Not difficult at all Somewhat difficult           ROS All review of systems negative except what is listed in the HPI    Objective:     BP 120/82 (BP Location: Right Arm, Patient Position: Sitting, Cuff Size: Normal)   Pulse 87   Temp 97.6 F (36.4 C) (Oral)   Resp 16   Ht '5\' 6"'$  (1.676 m)   Wt 131 lb (59.4 kg)   SpO2 97%   BMI 21.14 kg/m      Physical Exam Vitals reviewed.  Constitutional:      Appearance: Normal appearance.  Cardiovascular:     Rate and Rhythm: Normal rate and regular rhythm.     Pulses: Normal pulses.     Heart sounds: Normal heart sounds.  Pulmonary:     Effort: Pulmonary effort is normal.     Breath sounds: Normal breath sounds.  Skin:    General: Skin is warm and dry.  Neurological:     Mental Status: She is alert and oriented to person, place, and time.  Psychiatric:        Mood and Affect: Mood normal.        Behavior: Behavior normal.         Thought Content: Thought content normal.        Judgment: Judgment normal.       No results found for any visits on 08/29/22.    The ASCVD Risk score (Arnett DK, et al., 2019) failed to calculate for the following reasons:   Cannot find a previous HDL lab   Cannot find a previous total cholesterol lab    Assessment & Plan:   Problem List Items Addressed This Visit       Other   Anxiety and depression - Primary    No SI/HI.  Changing to Wellbutrin today at her request to hopefully avoid decreased libido adverse effects of SSRIs.  Follow-up in 6 weeks or so or sooner if needed.       Relevant Medications   buPROPion (WELLBUTRIN XL) 150 MG 24 hr tablet    Return in about 6 weeks (around 10/10/2022), or if symptoms  worsen or fail to improve, for mood f/u.    Terrilyn Saver, NP

## 2022-08-30 DIAGNOSIS — M419 Scoliosis, unspecified: Secondary | ICD-10-CM | POA: Diagnosis not present

## 2022-08-30 DIAGNOSIS — M47816 Spondylosis without myelopathy or radiculopathy, lumbar region: Secondary | ICD-10-CM | POA: Diagnosis not present

## 2022-08-30 DIAGNOSIS — M4300 Spondylolysis, site unspecified: Secondary | ICD-10-CM | POA: Diagnosis not present

## 2022-09-28 ENCOUNTER — Other Ambulatory Visit: Payer: Self-pay

## 2022-09-28 ENCOUNTER — Other Ambulatory Visit (HOSPITAL_BASED_OUTPATIENT_CLINIC_OR_DEPARTMENT_OTHER): Payer: Self-pay

## 2022-10-06 DIAGNOSIS — R6882 Decreased libido: Secondary | ICD-10-CM | POA: Diagnosis not present

## 2022-10-06 DIAGNOSIS — R5381 Other malaise: Secondary | ICD-10-CM | POA: Diagnosis not present

## 2022-10-06 DIAGNOSIS — R208 Other disturbances of skin sensation: Secondary | ICD-10-CM | POA: Diagnosis not present

## 2022-10-06 DIAGNOSIS — G4700 Insomnia, unspecified: Secondary | ICD-10-CM | POA: Diagnosis not present

## 2022-10-06 DIAGNOSIS — E559 Vitamin D deficiency, unspecified: Secondary | ICD-10-CM | POA: Diagnosis not present

## 2022-10-06 DIAGNOSIS — N951 Menopausal and female climacteric states: Secondary | ICD-10-CM | POA: Diagnosis not present

## 2022-10-12 ENCOUNTER — Telehealth: Payer: Self-pay | Admitting: Family Medicine

## 2022-10-12 NOTE — Telephone Encounter (Signed)
Patient called to say that she is still "super irritated and wanting to claw people's eyes out" so she wants to know if she should increase her wellbutrin from 150 mg to 300 mg or should she go back to the prozac since she was not feeling that way before on that medication. Please call to advise.

## 2022-10-13 DIAGNOSIS — F4320 Adjustment disorder, unspecified: Secondary | ICD-10-CM | POA: Diagnosis not present

## 2022-10-13 NOTE — Telephone Encounter (Signed)
Pt scheduled  

## 2022-10-14 ENCOUNTER — Encounter: Payer: Self-pay | Admitting: Family Medicine

## 2022-10-14 ENCOUNTER — Other Ambulatory Visit (HOSPITAL_BASED_OUTPATIENT_CLINIC_OR_DEPARTMENT_OTHER): Payer: Self-pay

## 2022-10-14 ENCOUNTER — Ambulatory Visit (INDEPENDENT_AMBULATORY_CARE_PROVIDER_SITE_OTHER): Payer: Commercial Managed Care - PPO | Admitting: Family Medicine

## 2022-10-14 VITALS — BP 109/78 | HR 78 | Temp 98.0°F | Resp 16 | Ht 66.0 in | Wt 131.4 lb

## 2022-10-14 DIAGNOSIS — F32A Depression, unspecified: Secondary | ICD-10-CM | POA: Diagnosis not present

## 2022-10-14 DIAGNOSIS — F419 Anxiety disorder, unspecified: Secondary | ICD-10-CM

## 2022-10-14 MED ORDER — FLUOXETINE HCL 10 MG PO CAPS
10.0000 mg | ORAL_CAPSULE | Freq: Every day | ORAL | 1 refills | Status: DC
  Filled 2022-10-14: qty 90, 90d supply, fill #0
  Filled 2022-12-26 – 2022-12-27 (×2): qty 90, 90d supply, fill #1

## 2022-10-14 NOTE — Progress Notes (Signed)
Established Patient Office Visit  Subjective   Patient ID: Vanessa [name], female    DOB: Apr 08, 1976  Age: 48 y.o. MRN: [id number]  Chief Complaint  Patient presents with   Medication Problem    HPI  Patient is here to discuss her mood.   At last visit she wanted to stop the Prozac due to sexual dysfunction and try Wellbutrin instead. States that sexual symptoms have resolved, but she is not thinking the Wellbutrin is controlling her mood. She reports feeling very irritable all of the time. She really wants her mood better controlled. She is getting therapy already. No SI/HI.        10/14/2022    1:10 PM 10/14/2022   12:56 PM 08/29/2022    2:53 PM  PHQ9 SCORE ONLY  PHQ-9 Total Score '10 10 4      '$ 10/14/2022    1:11 PM 10/14/2022   12:57 PM 08/29/2022    3:22 PM 08/29/2022    1:45 PM  GAD 7 : Generalized Anxiety Score  Nervous, Anxious, on Edge 0 0 2 0  Control/stop worrying 0 0 1 0  Worry too much - different things 0 0 1 0  Trouble relaxing '3 3 2 '$ 0  Restless 0 0 1 0  Easily annoyed or irritable '3 3 2 '$ 0  Afraid - awful might happen 0 0 0 0  Total GAD 7 Score '6 6 9 '$ 0  Anxiety Difficulty Very difficult Very difficult  Not difficult at all        ROS All review of systems negative except what is listed in the HPI    Objective:     BP 109/78   Pulse 78   Temp 98 F (36.7 C)   Resp 16   Ht '5\' 6"'$  (1.676 m)   Wt 131 lb 6.4 oz (59.6 kg)   SpO2 98%   BMI 21.21 kg/m    Physical Exam Vitals reviewed.  Constitutional:      Appearance: Normal appearance.  Cardiovascular:     Rate and Rhythm: Normal rate and regular rhythm.     Pulses: Normal pulses.     Heart sounds: Normal heart sounds.  Pulmonary:     Effort: Pulmonary effort is normal.     Breath sounds: Normal breath sounds.  Skin:    General: Skin is warm and dry.  Neurological:     Mental Status: She is alert and oriented to person, place, and time.  Psychiatric:        Mood and Affect: Mood  normal.        Behavior: Behavior normal.        Thought Content: Thought content normal.        Judgment: Judgment normal.      No results found for any visits on 10/14/22.    The ASCVD Risk score (Arnett DK, et al., 2019) failed to calculate for the following reasons:   Cannot find a previous HDL lab   Cannot find a previous total cholesterol lab    Assessment & Plan:   Problem List Items Addressed This Visit       Other   Anxiety and depression - Primary Discussed varies options with patients (increasing Wellbutrin, starting low dose Prozac again, changing to different SSRI/SNRI). Since the Prozac was working really well for her mood previously, she decided to restart at a lower dose. We will see if the low dose is enough to control mood and not cause as many  sexual side effects. Continue counseling.      Relevant Medications   FLUoxetine (PROZAC) 10 MG capsule    Follow-up in 4-6 weeks to discuss mood or sooner if needed.    Terrilyn Saver, NP

## 2022-10-20 DIAGNOSIS — F4320 Adjustment disorder, unspecified: Secondary | ICD-10-CM | POA: Diagnosis not present

## 2022-10-27 DIAGNOSIS — F4320 Adjustment disorder, unspecified: Secondary | ICD-10-CM | POA: Diagnosis not present

## 2022-11-10 DIAGNOSIS — F4320 Adjustment disorder, unspecified: Secondary | ICD-10-CM | POA: Diagnosis not present

## 2022-11-24 ENCOUNTER — Other Ambulatory Visit (HOSPITAL_BASED_OUTPATIENT_CLINIC_OR_DEPARTMENT_OTHER): Payer: Self-pay

## 2022-11-24 DIAGNOSIS — M4300 Spondylolysis, site unspecified: Secondary | ICD-10-CM | POA: Diagnosis not present

## 2022-11-24 DIAGNOSIS — F4320 Adjustment disorder, unspecified: Secondary | ICD-10-CM | POA: Diagnosis not present

## 2022-11-24 MED ORDER — MELOXICAM 15 MG PO TABS
15.0000 mg | ORAL_TABLET | Freq: Every day | ORAL | 2 refills | Status: DC
  Filled 2022-11-24: qty 30, 30d supply, fill #0
  Filled 2022-12-26: qty 30, 30d supply, fill #1
  Filled 2023-01-20: qty 30, 30d supply, fill #2

## 2022-12-07 DIAGNOSIS — M545 Low back pain, unspecified: Secondary | ICD-10-CM | POA: Diagnosis not present

## 2022-12-07 DIAGNOSIS — M4300 Spondylolysis, site unspecified: Secondary | ICD-10-CM | POA: Diagnosis not present

## 2022-12-12 DIAGNOSIS — M25561 Pain in right knee: Secondary | ICD-10-CM | POA: Diagnosis not present

## 2022-12-13 DIAGNOSIS — M25561 Pain in right knee: Secondary | ICD-10-CM | POA: Diagnosis not present

## 2022-12-14 DIAGNOSIS — M4306 Spondylolysis, lumbar region: Secondary | ICD-10-CM | POA: Diagnosis not present

## 2022-12-14 DIAGNOSIS — M4317 Spondylolisthesis, lumbosacral region: Secondary | ICD-10-CM | POA: Diagnosis not present

## 2022-12-14 DIAGNOSIS — M4807 Spinal stenosis, lumbosacral region: Secondary | ICD-10-CM | POA: Diagnosis not present

## 2022-12-26 ENCOUNTER — Other Ambulatory Visit (HOSPITAL_BASED_OUTPATIENT_CLINIC_OR_DEPARTMENT_OTHER): Payer: Self-pay

## 2022-12-26 ENCOUNTER — Other Ambulatory Visit: Payer: Self-pay | Admitting: Family Medicine

## 2022-12-26 ENCOUNTER — Other Ambulatory Visit: Payer: Self-pay

## 2022-12-26 DIAGNOSIS — Z3041 Encounter for surveillance of contraceptive pills: Secondary | ICD-10-CM

## 2022-12-26 MED ORDER — SETLAKIN 0.15-0.03 MG PO TABS
1.0000 | ORAL_TABLET | Freq: Every day | ORAL | 1 refills | Status: DC
  Filled 2022-12-26: qty 91, 84d supply, fill #0
  Filled 2023-03-22: qty 91, 84d supply, fill #1

## 2022-12-27 ENCOUNTER — Other Ambulatory Visit (HOSPITAL_BASED_OUTPATIENT_CLINIC_OR_DEPARTMENT_OTHER): Payer: Self-pay

## 2022-12-27 ENCOUNTER — Ambulatory Visit (HOSPITAL_COMMUNITY)
Admission: RE | Admit: 2022-12-27 | Discharge: 2022-12-27 | Disposition: A | Discharge status: Home / Self Care | Payer: Commercial Managed Care - PPO | Source: Ambulatory Visit | Attending: Cardiovascular Disease | Admitting: Cardiovascular Disease

## 2022-12-27 ENCOUNTER — Other Ambulatory Visit (HOSPITAL_COMMUNITY): Payer: Self-pay | Admitting: Orthopaedic Surgery

## 2022-12-27 DIAGNOSIS — M79661 Pain in right lower leg: Secondary | ICD-10-CM

## 2022-12-27 DIAGNOSIS — M7989 Other specified soft tissue disorders: Secondary | ICD-10-CM

## 2022-12-27 DIAGNOSIS — M25561 Pain in right knee: Secondary | ICD-10-CM | POA: Diagnosis not present

## 2022-12-30 DIAGNOSIS — M25561 Pain in right knee: Secondary | ICD-10-CM | POA: Diagnosis not present

## 2023-01-02 ENCOUNTER — Encounter: Payer: Self-pay | Admitting: *Deleted

## 2023-01-05 ENCOUNTER — Telehealth: Payer: Self-pay | Admitting: Family Medicine

## 2023-01-05 DIAGNOSIS — W908XXA Exposure to other nonionizing radiation, initial encounter: Secondary | ICD-10-CM | POA: Diagnosis not present

## 2023-01-05 DIAGNOSIS — D229 Melanocytic nevi, unspecified: Secondary | ICD-10-CM | POA: Diagnosis not present

## 2023-01-05 DIAGNOSIS — L821 Other seborrheic keratosis: Secondary | ICD-10-CM | POA: Diagnosis not present

## 2023-01-05 DIAGNOSIS — F4323 Adjustment disorder with mixed anxiety and depressed mood: Secondary | ICD-10-CM | POA: Diagnosis not present

## 2023-01-05 DIAGNOSIS — L578 Other skin changes due to chronic exposure to nonionizing radiation: Secondary | ICD-10-CM | POA: Diagnosis not present

## 2023-01-05 NOTE — Telephone Encounter (Signed)
Patient verified name & DOB when she first called, but called the patient back to verify again and she stated that she had to go to [facility] after hours when it happened and has since followed up with them.

## 2023-01-05 NOTE — Telephone Encounter (Signed)
Are we sure this is the right person? I don't see anything in the chart about knee issues.

## 2023-01-05 NOTE — Telephone Encounter (Signed)
Patient called to get a referral to Physical medicine and rehab center for her knee so she can have other options other than surgery. She would also like an order for a bone density scan to make sure nothing is going on with her bones since the knee fracture happened when her dog ran into her. She doesn't thing that amount of pressure should've caused so much damage. Please advise.

## 2023-01-05 NOTE — Telephone Encounter (Signed)
You have not seen patient about this before. Please advise?

## 2023-01-06 NOTE — Telephone Encounter (Signed)
Patient made aware. She will contact Orthopedics and call us back if needed.

## 2023-01-10 ENCOUNTER — Encounter: Payer: Self-pay | Admitting: Family Medicine

## 2023-01-10 ENCOUNTER — Encounter: Payer: Self-pay | Admitting: Internal Medicine

## 2023-01-11 ENCOUNTER — Ambulatory Visit: Payer: Commercial Managed Care - PPO | Admitting: Family Medicine

## 2023-01-11 ENCOUNTER — Encounter: Payer: Self-pay | Admitting: Family Medicine

## 2023-01-11 VITALS — BP 112/63 | HR 63 | Ht 66.0 in | Wt 137.0 lb

## 2023-01-11 DIAGNOSIS — Z8781 Personal history of (healed) traumatic fracture: Secondary | ICD-10-CM | POA: Diagnosis not present

## 2023-01-11 DIAGNOSIS — T07XXXA Unspecified multiple injuries, initial encounter: Secondary | ICD-10-CM

## 2023-01-11 DIAGNOSIS — K439 Ventral hernia without obstruction or gangrene: Secondary | ICD-10-CM

## 2023-01-11 DIAGNOSIS — N951 Menopausal and female climacteric states: Secondary | ICD-10-CM

## 2023-01-11 NOTE — Progress Notes (Signed)
Acute Office Visit  Subjective:     Patient ID: Vanessa [name], female    DOB: 03/23/1976, 47 y.o.   MRN: [id number]  Chief Complaint  Patient presents with   Hernia    HPI Patient is in today for hernia.    Patient states she has had an abdominal/ventral for many years now. She saw surgery team many years ago, but decided to hold off due to mesh recalls at the time. She is now having some mild discomfort when bending, exercising, etc and would like to have another consult to discuss repair. She denies any severe pain, redness. States the area is easily reducible.   She also mentions a fracture to her knee several weeks ago after her dog bumped into her while they were throwing frisbee outside. States it was not a hard hit and she did not fall or have any other trauma so she is wondering why that would've fractures so easily. She also reports having a shoulder injury with fracture several years ago that she didn't think was traumatic enough to cause a fracture. She would like [place] to see if we can get a DEXA to check bone density. No known pathologic fractures. Not menopausal - still having periods fairly regularly.    ROS All review of systems negative except what is listed in the HPI      Objective:    BP 112/63   Pulse 63   Ht  (1.676 m)   Wt 137 lb (62.1 kg)   SpO2 100%   BMI 22.11 kg/m    Physical Exam Vitals reviewed.  Constitutional:      Appearance: Normal appearance.  Abdominal:     General: Abdomen is flat. There is no distension.     Palpations: Abdomen is soft.     Tenderness: There is no abdominal tenderness. There is no guarding.     Hernia: A hernia is present.  Musculoskeletal:        General: Normal range of motion.  Skin:    General: Skin is warm and dry.  Neurological:     Mental Status: She is alert.  Psychiatric:        Mood and Affect: Mood normal.        Behavior: Behavior normal.        Thought Content: Thought content normal.         Judgment: Judgment normal.     No results found for any visits on 01/11/23.      Assessment & Plan:   Problem List Items Addressed This Visit   None Visit Diagnoses     Fractures    -  Primary Perimenopausal symptoms She is wondering why she has had some strange fractures the past few years with minimal trauma. She would like to see if we can get a DEXA scan covered. Order placed, but encouraged her to check with insurance before completing to see cost.  Checking BMP and Vitamin D as well   Relevant Orders   DG Bone Density   Basic Metabolic Panel (BMET)   Vitamin D (25 hydroxy)                  Ventral hernia without obstruction or gangrene     Referral to general surgery    Relevant Orders   Ambulatory referral to General Surgery       No orders of the defined types were placed in this encounter.   Return if symptoms worsen  or fail to improve.  Terrilyn Saver, NP

## 2023-01-12 LAB — BASIC METABOLIC PANEL
BUN: 18 mg/dL (ref 6–23)
CO2: 26 mEq/L (ref 19–32)
Calcium: 8.8 mg/dL (ref 8.4–10.5)
Chloride: 105 mEq/L (ref 96–112)
Creatinine, Ser: 0.92 mg/dL (ref 0.40–1.20)
GFR: 74.47 mL/min (ref >60.00)
Glucose, Bld: 129 mg/dL — ABNORMAL HIGH (ref 70–99)
Potassium: 4 mEq/L (ref 3.5–5.1)
Sodium: 138 mEq/L (ref 135–145)

## 2023-01-12 LAB — VITAMIN D 25 HYDROXY (VIT D DEFICIENCY, FRACTURES): VITD: 40.23 ng/mL (ref 30.00–100.00)

## 2023-01-18 ENCOUNTER — Other Ambulatory Visit (HOSPITAL_BASED_OUTPATIENT_CLINIC_OR_DEPARTMENT_OTHER): Payer: Self-pay

## 2023-01-18 ENCOUNTER — Ambulatory Visit (AMBULATORY_SURGERY_CENTER): Payer: Commercial Managed Care - PPO

## 2023-01-18 VITALS — Ht 66.0 in | Wt 135.0 lb

## 2023-01-18 DIAGNOSIS — Z1211 Encounter for screening for malignant neoplasm of colon: Secondary | ICD-10-CM

## 2023-01-18 MED ORDER — NA SULFATE-K SULFATE-MG SULF 17.5-3.13-1.6 GM/177ML PO SOLN
1.0000 | Freq: Once | ORAL | 0 refills | Status: AC
  Filled 2023-01-18: qty 354, 1d supply, fill #0

## 2023-01-18 NOTE — Progress Notes (Signed)
Pre visit completed via phone call; Patient verified name, DOB, and address;  No egg or soy allergy known to patient;  No issues known to pt with past sedation with any surgeries or procedures; Patient denies ever being told they had issues or difficulty with intubation;  No FH of Malignant Hyperthermia; Pt is not on diet pills; Pt is not on home 02;  Pt is not on blood thinners; Pt denies issues with constipation;  No A fib or A flutter; Have any cardiac testing pending--NO Pt instructed to use [email or web address] or GoodRx for a price reduction on prep;   Insurance verified during PV appt=Aetna  Patient's chart reviewed by Cathlyn Parsons CNRA prior to previsit and patient appropriate for the [facility].  Previsit completed and red dot placed by patient's name on their procedure day (on provider's schedule).    Instructions sent to patient via MyChart;

## 2023-01-19 ENCOUNTER — Other Ambulatory Visit (HOSPITAL_BASED_OUTPATIENT_CLINIC_OR_DEPARTMENT_OTHER): Payer: Self-pay

## 2023-01-19 DIAGNOSIS — F4323 Adjustment disorder with mixed anxiety and depressed mood: Secondary | ICD-10-CM | POA: Diagnosis not present

## 2023-01-19 DIAGNOSIS — H0011 Chalazion right upper eyelid: Secondary | ICD-10-CM | POA: Diagnosis not present

## 2023-01-19 MED ORDER — DOXYCYCLINE MONOHYDRATE 100 MG PO CAPS
100.0000 mg | ORAL_CAPSULE | Freq: Two times a day (BID) | ORAL | 0 refills | Status: DC
  Filled 2023-01-19: qty 20, 10d supply, fill #0

## 2023-01-20 ENCOUNTER — Other Ambulatory Visit (HOSPITAL_BASED_OUTPATIENT_CLINIC_OR_DEPARTMENT_OTHER): Payer: Self-pay

## 2023-01-24 NOTE — Therapy (Signed)
OUTPATIENT PHYSICAL THERAPY THORACOLUMBAR EVALUATION   Patient Name: Vanessa [name] MRN: [id number] DOB:21-Jun-1976, 47 y.o., female Today's Date: 01/26/2023  END OF SESSION:  PT End of Session - 01/26/23 0948     Visit Number 1    Number of Visits 6    Date for PT Re-Evaluation 03/09/23    Authorization Type [employer]et[employer]: [facility]    PT Start Time 0935    PT Stop Time 1030    PT Time Calculation (min) 55 min    Activity Tolerance Patient tolerated treatment well    Behavior During Therapy WFL for tasks assessed/performed             Past Medical History:  Diagnosis Date   Abnormal Pap smear    AMA (advanced maternal age) multigravida 35+    Anxiety    Hx of varicella    Seasonal allergies    Past Surgical History:  Procedure Laterality Date   AUGMENTATION MAMMAPLASTY Bilateral    BACK SURGERY     C5, C6   BREAST ENHANCEMENT SURGERY     LEEP     NASAL SEPTOPLASTY W/ TURBINOPLASTY Bilateral 03/08/2016   Procedure: BILATERAL NASAL SEPTOPLASTY WITH TURBINATE REDUCTION;  Surgeon: Drema Halon, MD;  Location: [facility];  Service: ENT;  Laterality: Bilateral;   NASAL SINUS SURGERY Bilateral 03/08/2016   Procedure: TOTAL BILATERAL ETHMOIDECTOMY AND MAXILLARY OSTEA ENLARGEMENT;  Surgeon: Drema Halon, MD;  Location: [facility];  Service: ENT;  Laterality: Bilateral;   SHOULDER ARTHROSCOPY Right 2022   Patient Active Problem List   Diagnosis Date Noted   Anxiety and depression 12/23/2021   Shoulder subluxation, right, initial encounter 01/05/2021   Traumatic incomplete tear of right rotator cuff 01/05/2021   SVD (spontaneous vaginal delivery) 01/22/2013   Postpartum care following vaginal delivery (5/6) 01/22/2013    PCP: Clayborne Dana, NP  REFERRING PROVIDER: Tressie Stalker, MD   REFERRING DIAG: M48.07  Foraminal Stenosis of Lumbosacral region  Rationale for Evaluation and Treatment: Rehabilitation  THERAPY DIAG:  Other low  back pain  Cramp and spasm  ONSET DATE: last February 2023  SUBJECTIVE:                                                                                                                                                                                           SUBJECTIVE STATEMENT: I was sitting down to start an IV and my back started to kill me, I got an MRI, they told me there is too much space between L5 and S1 and nothing could do about it, and when there is too much  space it curves and pinches on the other side, nothing to do other than PT, maybe surgery in the future. The pain started last February, but pain isn't every day.  I was also doing a lot of pilates and some of the movements made it worse, and the jeep also seems to make it worse.    PERTINENT HISTORY:  From MD notes on 01/11/23 "Patient states she has had an abdominal/ventral for many years now. She saw surgery team many years ago, but decided to hold off due to mesh recalls at the time. She is now having some mild discomfort when bending, exercising, etc and would like to have another consult to discuss repair. She denies any severe pain, redness. States the area is easily reducible.    She also mentions a fracture to her knee several weeks ago after her dog bumped into her while they were throwing frisbee outside. States it was not a hard hit and she did not fall or have any other trauma so she is wondering why that would've fractures so easily. She also reports having a shoulder injury with fracture several years ago that she didn't think was traumatic enough to cause a fracture. 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 Yes: NPRS scale:  0-4/10 Pain location: middle of lumbar spine Pain description: tight today, like hot iron at times Aggravating factors: bending, sitting for too long, picking up something off floor, bridging Relieving factors: heat, advil  PRECAUTIONS: None  WEIGHT BEARING RESTRICTIONS: No  FALLS:  Has patient fallen in last 6 months? Yes. 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   NEXT MD VISIT: none scheduled, follow-up for hernia repair on June 30th  OBJECTIVE:   DIAGNOSTIC FINDINGS:  MRI 12/07/22 demonstrates mild lumbar scoliosis/rotation, grade 1 spondylolisthesis at L5-S1 with L5 spondylolysis.  Foraminal stenosis at L5-S1 on Right.  Other levels are fairly unremarkable.   PATIENT SURVEYS:  Modified Oswestry 19/50 = 38% impairment  FOTO 54%, predicted outcome 66%  SCREENING FOR RED FLAGS: Bowel or bladder incontinence: No Spinal tumors: No Cauda equina syndrome: No Compression fracture: No Abdominal aneurysm: No  COGNITION: Overall cognitive status: Within functional limits for tasks assessed     SENSATION: WFL  MUSCLE LENGTH: Hamstrings: Right 120 deg; Left 120 deg Quadriceps: Right : limited by knee injury; Left heel to buttock in prone.   POSTURE: increased lumbar lordosis  PALPATION: Increased spasm in R lumbar paraspinals, tenderness R SIJ  LUMBAR ROM:   AROM eval  Flexion To floor, tightness  Extension 50% limited, p!  Right lateral flexion Past knee  Left lateral flexion Past knee  Right rotation WNL  Left rotation WNL   (Blank rows = not tested)  LOWER EXTREMITY ROM:   hip ROM WNL, R knee ROM limited by injury  LOWER EXTREMITY MMT:   5/5 bil LE strength    LUMBAR  SPECIAL TESTS:  Straight leg raise test: Negative, Sacral thrust negative  FUNCTIONAL TESTS:   GAIT: Distance walked: 110' Assistive device utilized: None Level of assistance: Complete Independence Comments: no significant deviation despite R knee injury.  Wearing R knee brace.   TODAY'S TREATMENT:                                                                                                                              DATE:   01/26/2023 Manual Therapy: to decrease muscle spasm and pain and improve mobility STM/TPR to R lumbar paraspinals, UPA mobs R lumbar spine,  skilled palpation and monitoring during dry needling. Trigger Point Dry-Needling  Treatment instructions: Expect mild to moderate muscle soreness. S/S of pneumothorax if dry needled over a lung field, and to seek immediate medical attention should they occur. Patient verbalized understanding of these instructions and education. Patient Consent Given: Yes Education handout provided: Yes Muscles treated: R L3-5 lumbar multifidi Electrical stimulation performed: No Parameters: N/A Treatment response/outcome: Palpable Increase in Muscle Length    PATIENT EDUCATION:  Education details: findings, POC, TrDN Person educated: Patient Education method: Explanation and Handouts Education comprehension: verbalized understanding  HOME EXERCISE PROGRAM: TBD  ASSESSMENT:  CLINICAL IMPRESSION: Vanessa [name] is a 47 y.o. female who was seen today for physical therapy evaluation and treatment for foraminal stenosis of lumbar region. She reports pain localized to midline of low back at L5 region especially with extension and endrange flexion.  All other movements were full and pain free.  She demonstrated good strength and flexibility although some what limited by R knee fracture today.  Noted increased lordosis in lumbar spine and hypertonicity of R lumbar erector spinae, after explanation of DN rational, procedures, outcomes and  potential side effects, patient verbalized consent to DN treatment in conjunction with manual STM/DTM and TPR to reduce ttp/muscle tension. Muscles treated as indicated above. DN produced normal response with good twitches elicited resulting in palpable reduction in pain/ttp and muscle tension, with patient noting less pain upon initiation of movement following DN. Pt educated to expect mild to moderate muscle soreness for up to 24-48 hrs and instructed to continue prescribed home exercise program and current activity level with pt verbalizing understanding of theses instructions.  Vanessa [name] is a good candidate for physical therapy to decrease low back pain, improve posture, and improve ability to perform all activities without limitation from low back pain.    OBJECTIVE IMPAIRMENTS: decreased activity tolerance, difficulty walking, increased fascial restrictions, increased muscle spasms, postural dysfunction, and pain.   ACTIVITY LIMITATIONS: carrying, lifting, bending, sitting, and locomotion level  PARTICIPATION LIMITATIONS: driving, community activity, occupation, and leisure activities like running  PERSONAL FACTORS: 3+ comorbidities: lumbar foraminal stenosis, lumbar spondylosis, anxiety, depression, abdominal hernia, history breast augmentation, history R RCR , R knee fracture, ACDF  are also affecting patient's functional outcome.   REHAB POTENTIAL: Good  CLINICAL DECISION MAKING: Evolving/moderate complexity  EVALUATION COMPLEXITY: Moderate   GOALS: Goals reviewed with patient? Yes  SHORT TERM GOALS:  Target date: 02/09/2023   Patient will be independent with initial HEP.  Baseline: needs  Goal status: INITIAL  LONG TERM GOALS: Target date: 03/09/2023    Patient will be independent with advanced/ongoing HEP to improve outcomes and carryover.  Baseline:  Goal status: INITIAL  2.  Patient will report 75% improvement in low back pain to improve QOL.  Baseline:  Goal status:  INITIAL  3.  Patient will demonstrate full pain free lumbar ROM to perform ADLs.   Baseline: see objective  Goal status: INITIAL  4.  Patient will be able to walk > 4 miles without increased low back pain to start training for marathon. Baseline: increased LBP after 3 miles Goal status: INITIAL  5.  Patient will report 16 on lumbar FOTO to demonstrate improved functional ability.  Baseline: 54 Goal status: INITIAL   6.  Patient will tolerate sitting > 60 minutes without LBP to travel.  Baseline: burns, painful for more than an hour Goal status: INITIAL  PLAN:  PT FREQUENCY: 1-2x/week  PT DURATION: 6 weeks  PLANNED INTERVENTIONS: Therapeutic exercises, Therapeutic activity, Neuromuscular re-education, Balance training, Gait training, Patient/Family education, Self Care, Joint mobilization, Stair training, Orthotic/Fit training, Aquatic Therapy, Dry Needling, Electrical stimulation, Spinal mobilization, Cryotherapy, Moist heat, Traction, Ultrasound, Manual therapy, and Re-evaluation.  PLAN FOR NEXT SESSION: Neutral spine exercises, check supine to long sit, manual and TrDN   Jena Gauss, PT, DPT  01/26/2023, 10:59 AM

## 2023-01-26 ENCOUNTER — Encounter: Payer: Self-pay | Admitting: Internal Medicine

## 2023-01-26 ENCOUNTER — Encounter: Payer: Self-pay | Admitting: Physical Therapy

## 2023-01-26 ENCOUNTER — Ambulatory Visit: Payer: Commercial Managed Care - PPO | Attending: Neurosurgery | Admitting: Physical Therapy

## 2023-01-26 DIAGNOSIS — M5459 Other low back pain: Secondary | ICD-10-CM | POA: Insufficient documentation

## 2023-01-26 DIAGNOSIS — R252 Cramp and spasm: Secondary | ICD-10-CM | POA: Diagnosis not present

## 2023-01-26 NOTE — Patient Instructions (Signed)

## 2023-02-02 ENCOUNTER — Encounter: Payer: Self-pay | Admitting: Physical Therapy

## 2023-02-02 ENCOUNTER — Ambulatory Visit: Payer: Commercial Managed Care - PPO | Admitting: Physical Therapy

## 2023-02-02 DIAGNOSIS — R252 Cramp and spasm: Secondary | ICD-10-CM

## 2023-02-02 DIAGNOSIS — M5459 Other low back pain: Secondary | ICD-10-CM | POA: Diagnosis not present

## 2023-02-02 NOTE — Therapy (Signed)
OUTPATIENT PHYSICAL THERAPY TREATMENT   Patient Name: Vanessa [name] MRN: [id number] DOB:[birth year], 47 y.o., female Today's Date: 02/02/2023  END OF SESSION:  PT End of Session - 02/02/23 0845     Visit Number 2    Number of Visits 6    Date for PT Re-Evaluation 03/09/23    Authorization Type [employer]: [facility]    PT Start Time 0847    PT Stop Time 0933    PT Time Calculation (min) 46 min    Activity Tolerance Patient tolerated treatment well    Behavior During Therapy WFL for tasks assessed/performed             Past Medical History:  Diagnosis Date   Abnormal Pap smear    AMA (advanced maternal age) multigravida 35+    Anxiety    Hx of varicella    Seasonal allergies    Past Surgical History:  Procedure Laterality Date   AUGMENTATION MAMMAPLASTY Bilateral    BACK SURGERY     C5, C6   BREAST ENHANCEMENT SURGERY     LEEP     NASAL SEPTOPLASTY W/ TURBINOPLASTY Bilateral 03/08/2016   Procedure: BILATERAL NASAL SEPTOPLASTY WITH TURBINATE REDUCTION;  Surgeon: Drema Halon, MD;  Location: [facility];  Service: ENT;  Laterality: Bilateral;   NASAL SINUS SURGERY Bilateral 03/08/2016   Procedure: TOTAL BILATERAL ETHMOIDECTOMY AND MAXILLARY OSTEA ENLARGEMENT;  Surgeon: Drema Halon, MD;  Location: [facility];  Service: ENT;  Laterality: Bilateral;   SHOULDER ARTHROSCOPY Right 2022   Patient Active Problem List   Diagnosis Date Noted   Anxiety and depression 12/23/2021   Shoulder subluxation, right, initial encounter 01/05/2021   Traumatic incomplete tear of right rotator cuff 01/05/2021   SVD (spontaneous vaginal delivery) 01/22/2013   Postpartum care following vaginal delivery (5/6) 01/22/2013    PCP: Clayborne Dana, NP  REFERRING PROVIDER: Tressie Stalker, MD   REFERRING DIAG: M48.07  Foraminal Stenosis of Lumbosacral region  Rationale for Evaluation and Treatment: Rehabilitation  THERAPY DIAG:  Other low back  pain  Cramp and spasm  ONSET DATE: last February 2023  SUBJECTIVE:                                                                                                                                                                                           SUBJECTIVE STATEMENT: Back is better, night and day difference, other side hurts a bit.  Had x-rays of knees and told it will heal on its own.  Just told not to do anything and has new order for DN for knee as well.  From Eval: "I was sitting down to start an IV and my back started to kill me, I got an MRI, they told me there is too much space between L5 and S1 and nothing could do about it, and when there is too much space it curves and pinches on the other side, nothing to do other than PT, maybe surgery in the future. The pain started last February, but pain isn't every day.  I was also doing a lot of pilates and some of the movements made it worse, and the jeep also seems to make it worse."    PERTINENT HISTORY:  From MD notes on 01/11/23 "Patient states she has had an abdominal/ventral for many years now. She saw surgery team many years ago, but decided to hold off due to mesh recalls at the time. She is now having some mild discomfort when bending, exercising, etc and would like to have another consult to discuss repair. She denies any severe pain, redness. States the area is easily reducible.    She also mentions a fracture to her knee several weeks ago after her dog bumped into her while they were throwing frisbee outside. States it was not a hard hit and she did not fall or have any other trauma so she is wondering why that would've fractures so easily. She also reports having a shoulder injury with fracture several years ago that she didn't think was traumatic enough to cause a fracture. 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 Yes: NPRS scale: 3/10 Pain location: L side low back around L1,  R knee 5/10 posterior Pain description: tight today, like hot iron at times Aggravating factors: bending, sitting for too long, picking up something off floor, bridging Relieving factors: heat, advil  PRECAUTIONS: None  WEIGHT BEARING RESTRICTIONS: No  FALLS:  Has patient fallen in last 6 months? Yes. 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   NEXT MD VISIT: none scheduled, follow-up for hernia repair on June 30th  OBJECTIVE:   DIAGNOSTIC FINDINGS:  MRI 12/07/22 demonstrates mild lumbar scoliosis/rotation, grade 1 spondylolisthesis at L5-S1 with L5 spondylolysis.  Foraminal stenosis at L5-S1 on Right.  Other levels are fairly unremarkable.   PATIENT SURVEYS:  Modified Oswestry 19/50 = 38% impairment  FOTO 54%, predicted outcome 66%  SCREENING FOR RED FLAGS: Bowel or bladder incontinence: No Spinal tumors: No Cauda equina syndrome: No Compression fracture: No Abdominal aneurysm: No  COGNITION: Overall cognitive status: Within functional limits for tasks assessed     SENSATION: WFL  MUSCLE LENGTH: Hamstrings: Right 120 deg; Left 120 deg Quadriceps: Right : limited by knee injury; Left heel to buttock in prone.   POSTURE: increased lumbar lordosis  PALPATION: Increased spasm in R lumbar paraspinals, tenderness R SIJ  LUMBAR ROM:   AROM eval  Flexion To floor, tightness  Extension 50% limited, p!  Right lateral flexion Past knee   Left lateral flexion Past knee  Right rotation WNL  Left rotation WNL   (Blank rows = not tested)  LOWER EXTREMITY ROM:   hip ROM WNL, R knee ROM limited by injury  LOWER EXTREMITY MMT:   5/5 bil LE strength    LUMBAR SPECIAL TESTS:  Straight leg raise test: Negative, Sacral thrust negative  FUNCTIONAL TESTS:   GAIT: Distance walked: 63' Assistive device utilized: None Level of assistance: Complete Independence Comments: no significant deviation despite R knee injury.  Wearing R knee brace.   TODAY'S TREATMENT:                                                                                                                              DATE:   02/02/2023 Therapeutic Exercise: to improve strength and mobility.  Demo, verbal and tactile cues throughout for technique. Bike L1 x 5 min  -reassured bike good choice for rehabbing from knee injuries as non-weightbearing Manual Therapy: to decrease muscle spasm and pain and improve mobility STM/TPR to L lumbar paraspinals, R peroneals, R hamstrings ,UPA mobs L lumbar spine,  skilled palpation and monitoring during dry needling.  KT taping to R ankle to decrease swelling (lantern pattern).  Trigger Point Dry-Needling  Treatment instructions: Expect mild to moderate muscle soreness. S/S of pneumothorax if dry needled over a lung field, and to seek immediate medical attention should they occur. Patient verbalized understanding of these instructions and education. Patient Consent Given: Yes Education handout provided: Previously provided Muscles treated: L L1-2 lumbar multifidi, R peroneals, L lateral hamstring. Electrical stimulation performed: No Parameters: N/A Treatment response/outcome: Palpable Increase in Muscle Length  01/26/2023 Manual Therapy: to decrease muscle spasm and pain and improve mobility STM/TPR to R lumbar paraspinals, UPA mobs R lumbar spine,  skilled palpation and monitoring during dry needling. Trigger Point  Dry-Needling  Treatment instructions: Expect mild to moderate muscle soreness. S/S of pneumothorax if dry needled over a lung field, and to seek immediate medical attention should they occur. Patient verbalized understanding of these instructions and education. Patient Consent Given: Yes Education handout provided: Yes Muscles treated: R L3-5 lumbar multifidi Electrical stimulation performed: No Parameters: N/A Treatment response/outcome: Palpable Increase in Muscle Length  PATIENT EDUCATION:  Education details: KT taping Person educated: Patient Education method: Chief Technology Officer Education comprehension: verbalized understanding  HOME EXERCISE PROGRAM: TBD  ASSESSMENT:  CLINICAL IMPRESSION: Vanessa [name] reported significant improvement in R sided low back pain after initial session, reported pain on L side slightly higher up today, as well as tightness throughout RLE that is impacting gait and may also be contributing to her LBP.  Also noted some congestion in R ankle so applied Ktape to stimlate lymphatics.  Manual therapy including TrDN to areas listed above.  Reported decreased pain and spasm in back following interventions.  Vanessa [name] continues to demonstrate potential for improvement and would benefit from continued skilled therapy to address impairments.     OBJECTIVE IMPAIRMENTS: decreased activity tolerance, difficulty walking, increased fascial  restrictions, increased muscle spasms, postural dysfunction, and pain.   ACTIVITY LIMITATIONS: carrying, lifting, bending, sitting, and locomotion level  PARTICIPATION LIMITATIONS: driving, community activity, occupation, and leisure activities like running  PERSONAL FACTORS: 3+ comorbidities: lumbar foraminal stenosis, lumbar spondylosis, anxiety, depression, abdominal hernia, history breast augmentation, history R RCR , R knee fracture, ACDF  are also affecting patient's functional outcome.   REHAB POTENTIAL:  Good  CLINICAL DECISION MAKING: Evolving/moderate complexity  EVALUATION COMPLEXITY: Moderate   GOALS: Goals reviewed with patient? Yes  SHORT TERM GOALS: Target date: 02/09/2023   Patient will be independent with initial HEP.  Baseline: needs  Goal status: INITIAL  LONG TERM GOALS: Target date: 03/09/2023    Patient will be independent with advanced/ongoing HEP to improve outcomes and carryover.  Baseline:  Goal status: INITIAL  2.  Patient will report 75% improvement in low back pain to improve QOL.  Baseline:  Goal status: INITIAL  3.  Patient will demonstrate full pain free lumbar ROM to perform ADLs.   Baseline: see objective  Goal status: INITIAL  4.  Patient will be able to walk > 4 miles without increased low back pain to start training for marathon. Baseline: increased LBP after 3 miles Goal status: INITIAL  5.  Patient will report 30 on lumbar FOTO to demonstrate improved functional ability.  Baseline: 54 Goal status: INITIAL   6.  Patient will tolerate sitting > 60 minutes without LBP to travel.  Baseline: burns, painful for more than an hour Goal status: INITIAL  PLAN:  PT FREQUENCY: 1-2x/week  PT DURATION: 6 weeks  PLANNED INTERVENTIONS: Therapeutic exercises, Therapeutic activity, Neuromuscular re-education, Balance training, Gait training, Patient/Family education, Self Care, Joint mobilization, Stair training, Orthotic/Fit training, Aquatic Therapy, Dry Needling, Electrical stimulation, Spinal mobilization, Cryotherapy, Moist heat, Traction, Ultrasound, Manual therapy, and Re-evaluation.  PLAN FOR NEXT SESSION: Neutral spine exercises, check supine to long sit, manual and TrDN   Jena Gauss, PT, DPT  02/02/2023, 9:39 AM

## 2023-02-05 ENCOUNTER — Encounter: Payer: Self-pay | Admitting: Certified Registered Nurse Anesthetist

## 2023-02-09 ENCOUNTER — Encounter: Payer: Commercial Managed Care - PPO | Admitting: Physical Therapy

## 2023-02-09 ENCOUNTER — Encounter: Payer: Self-pay | Admitting: Internal Medicine

## 2023-02-09 ENCOUNTER — Ambulatory Visit (AMBULATORY_SURGERY_CENTER): Payer: Commercial Managed Care - PPO | Admitting: Internal Medicine

## 2023-02-09 VITALS — BP 116/76 | HR 76 | Temp 98.3°F | Resp 16 | Ht 66.0 in | Wt 135.0 lb

## 2023-02-09 DIAGNOSIS — D124 Benign neoplasm of descending colon: Secondary | ICD-10-CM

## 2023-02-09 DIAGNOSIS — K635 Polyp of colon: Secondary | ICD-10-CM | POA: Diagnosis not present

## 2023-02-09 DIAGNOSIS — F419 Anxiety disorder, unspecified: Secondary | ICD-10-CM | POA: Diagnosis not present

## 2023-02-09 DIAGNOSIS — Z1211 Encounter for screening for malignant neoplasm of colon: Secondary | ICD-10-CM | POA: Diagnosis not present

## 2023-02-09 MED ORDER — SODIUM CHLORIDE 0.9 % IV SOLN
500.0000 mL | Freq: Once | INTRAVENOUS | Status: DC
Start: 2023-02-09 — End: 2023-02-09

## 2023-02-09 NOTE — Progress Notes (Signed)
Pt's states no medical or surgical changes since previsit or office visit. 

## 2023-02-09 NOTE — Progress Notes (Signed)
[facility] History and Physical   Primary Care Physician:  Clayborne Dana, NP   Reason for Procedure:   CRCA screening  Plan:    colonoscopy     HPI: Vanessa [name] is a 47 y.o. female here for screening exam   Past Medical History:  Diagnosis Date   Abnormal Pap smear    AMA (advanced maternal age) multigravida 35+    Anxiety    Hx of varicella    Seasonal allergies     Past Surgical History:  Procedure Laterality Date   AUGMENTATION MAMMAPLASTY Bilateral    BACK SURGERY     C5, C6   BREAST ENHANCEMENT SURGERY     LEEP     NASAL SEPTOPLASTY W/ TURBINOPLASTY Bilateral 03/08/2016   Procedure: BILATERAL NASAL SEPTOPLASTY WITH TURBINATE REDUCTION;  Surgeon: Drema Halon, MD;  Location: [facility];  Service: ENT;  Laterality: Bilateral;   NASAL SINUS SURGERY Bilateral 03/08/2016   Procedure: TOTAL BILATERAL ETHMOIDECTOMY AND MAXILLARY OSTEA ENLARGEMENT;  Surgeon: Drema Halon, MD;  Location: [facility];  Service: ENT;  Laterality: Bilateral;   SHOULDER ARTHROSCOPY Right 2022    Prior to Admission medications   Medication Sig Start Date End Date Taking? Authorizing Provider  FLUoxetine (PROZAC) 10 MG capsule Take 1 capsule (10 mg total) by mouth daily. 10/14/22  Yes Clayborne Dana, NP  SETLAKIN 0.15-0.03 MG tablet Take 1 tablet by mouth daily. Skip placebos 12/26/22  Yes Clayborne Dana, NP  ibuprofen (ADVIL,MOTRIN) 200 MG tablet Take 200 mg by mouth every 6 (six) hours as needed.    [provider]  meloxicam (MOBIC) 15 MG tablet Take 1 tablet (15 mg total) by mouth daily. 11/24/22     Multiple Vitamin (MULTIVITAMIN ADULT PO) Take 1 tablet by mouth daily.    [provider]    Current Outpatient Medications  Medication Sig Dispense Refill   FLUoxetine (PROZAC) 10 MG capsule Take 1 capsule (10 mg total) by mouth daily. 90 capsule 1   SETLAKIN 0.15-0.03 MG tablet Take 1 tablet by mouth daily. Skip  placebos 91 tablet 1   ibuprofen (ADVIL,MOTRIN) 200 MG tablet Take 200 mg by mouth every 6 (six) hours as needed.     meloxicam (MOBIC) 15 MG tablet Take 1 tablet (15 mg total) by mouth daily. 30 tablet 2   Multiple Vitamin (MULTIVITAMIN ADULT PO) Take 1 tablet by mouth daily.     Current Facility-Administered Medications  Medication Dose Route Frequency Provider Last Rate Last Admin   0.9 %  sodium chloride infusion  500 mL Intravenous Once Iva Boop, MD        Allergies as of 02/09/2023 - Review Complete 02/09/2023  Allergen Reaction Noted   Penicillins Hives and Other (See Comments) 02/08/2005   Sulfa antibiotics Hives 02/08/2005   Misc. sulfonamide containing compounds Hives 02/25/2022    Family History  Problem Relation Age of Onset   Rheum arthritis Mother    Anemia Mother    Migraines Mother    Diabetes Father    Hypertension Father    Colon polyps Maternal Grandmother 22   Colon cancer Maternal Grandmother 108   Diabetes Maternal Grandmother    Stroke Maternal Grandfather    Heart disease Maternal Grandfather    Esophageal cancer Paternal Grandfather        smoker   COPD Paternal Grandfather    Stomach cancer Neg Hx    Rectal cancer Neg Hx  Social History   Socioeconomic History   Marital status: Married    Spouse name: Not on file   Number of children: Not on file   Years of education: Not on file   Highest education level: Bachelor's degree (e.g., BA, AB, BS)  Occupational History   Not on file  Tobacco Use   Smoking status: Former    Types: Cigarettes    Quit date: 11/24/1996    Years since quitting: 26.2   Smokeless tobacco: Never  Vaping Use   Vaping Use: Never used  Substance and Sexual Activity   Alcohol use: Yes    Alcohol/week: 0.0 - 2.0 standard drinks of alcohol   Drug use: No   Sexual activity: Yes    Birth control/protection: Pill  Other Topics Concern   Not on file  Social History Narrative   Not on file   Social  Determinants of Health   Financial Resource Strain: Low Risk  (01/10/2023)   Overall Financial Resource Strain (CARDIA)    Difficulty of Paying Living Expenses: Not very hard  Food Insecurity: No Food Insecurity (01/10/2023)   Hunger Vital Sign    Worried About Running Out of Food in the Last Year: Never true    Ran Out of Food in the Last Year: Never true  Transportation Needs: No Transportation Needs (01/10/2023)   PRAPARE - Administrator, Civil Service (Medical): No    Lack of Transportation (Non-Medical): No  Physical Activity: Unknown (01/10/2023)   Exercise Vital Sign    Days of Exercise per Week: 0 days    Minutes of Exercise per Session: Not on file  Stress: Stress Concern Present (01/10/2023)   [employer] of Occupational Health - Occupational Stress Questionnaire    Feeling of Stress : To some extent  Social Connections: Unknown (01/10/2023)   Social Connection and Isolation Panel [NHANES]    Frequency of Communication with Friends and Family: Once a week    Frequency of Social Gatherings with Friends and Family: Once a week    Attends Religious Services: Patient declined    Database administrator or Organizations: No    Attends Engineer, structural: Not on file    Marital Status: Married  Catering manager Violence: Not on file    Review of Systems:  All other review of systems negative except as mentioned in the HPI.  Physical Exam: Vital signs BP 105/62   Pulse 74   Temp 98.3 F (36.8 C) (Temporal)   Ht 5\' 6"  (1.676 m)   Wt 135 lb (61.2 kg)   LMP 01/26/2023   SpO2 99%   BMI 21.79 kg/m   General:   Alert,  Well-developed, well-nourished, pleasant and cooperative in NAD Lungs:  Clear throughout to auscultation.   Heart:  Regular rate and rhythm; no murmurs, clicks, rubs,  or gallops. Abdomen:  Soft, nontender and nondistended. Normal bowel sounds.   Neuro/Psych:  Alert and cooperative. Normal mood and affect. A and O x 3   @Kitrina Maurin  Sena Slate, MD, [facility] [phone] (pager) 02/09/2023 10:05 AM@

## 2023-02-09 NOTE — Progress Notes (Signed)
Called to room to assist during endoscopic procedure.  Patient ID and intended procedure confirmed with present staff. Received instructions for my participation in the procedure from the performing physician.  

## 2023-02-09 NOTE — Patient Instructions (Addendum)
I found and removed one tiny polyp that looks benign.  All else normal.  I will let you know pathology results and when to have another routine colonoscopy by mail and/or My Chart.  I appreciate the opportunity to care for you. Iva Boop, MD, [facility]  You may resume all of your previous medications as ordered.  YOU HAD AN ENDOSCOPIC PROCEDURE TODAY AT THE [facility]:   Refer to the procedure report that was given to you for any specific questions about what was found during the examination.  If the procedure report does not answer your questions, please call your gastroenterologist to clarify.  If you requested that your care partner not be given the details of your procedure findings, then the procedure report has been included in a sealed envelope for you to review at your convenience later.  YOU SHOULD EXPECT: Some feelings of bloating in the abdomen. Passage of more gas than usual.  Walking can help get rid of the air that was put into your GI tract during the procedure and reduce the bloating. If you had a lower endoscopy (such as a colonoscopy or flexible sigmoidoscopy) you may notice spotting of blood in your stool or on the toilet paper. If you underwent a bowel prep for your procedure, you may not have a normal bowel movement for a few days.  Please Note:  You might notice some irritation and congestion in your nose or some drainage.  This is from the oxygen used during your procedure.  There is no need for concern and it should clear up in a day or so.  SYMPTOMS TO REPORT IMMEDIATELY:  Following lower endoscopy (colonoscopy or flexible sigmoidoscopy):  Excessive amounts of blood in the stool  Significant tenderness or worsening of abdominal pains  Swelling of the abdomen that is new, acute  Fever of 100F or higher   For urgent or emergent issues, a gastroenterologist can be reached at any hour by calling [phone]. Do not use MyChart messaging for urgent  concerns.    DIET:  We do recommend a small meal at first, but then you may proceed to your regular diet.  Drink plenty of fluids but you should avoid alcoholic beverages for 24 hours.  ACTIVITY:  You should plan to take it easy for the rest of today and you should NOT DRIVE or use heavy machinery until tomorrow (because of the sedation medicines used during the test).    FOLLOW UP: Our staff will call the number listed on your records the next business day following your procedure.  We will call around 7:15- 8:00 am to check on you and address any questions or concerns that you may have regarding the information given to you following your procedure. If we do not reach you, we will leave a message.     If any biopsies were taken you will be contacted by phone or by letter within the next 1-3 weeks.  Please call us at [phone] if you have not heard about the biopsies in 3 weeks.    SIGNATURES/CONFIDENTIALITY: You and/or your care partner have signed paperwork which will be entered into your electronic medical record.  These signatures attest to the fact that that the information above on your After Visit Summary has been reviewed and is understood.  Full responsibility of the confidentiality of this discharge information lies with you and/or your care-partner.YOU HAD AN ENDOSCOPIC PROCEDURE TODAY AT THE [facility]:   Refer to  the procedure report that was given to you for any specific questions about what was found during the examination.  If the procedure report does not answer your questions, please call your gastroenterologist to clarify.  If you requested that your care partner not be given the details of your procedure findings, then the procedure report has been included in a sealed envelope for you to review at your convenience later.  YOU SHOULD EXPECT: Some feelings of bloating in the abdomen. Passage of more gas than usual.  Walking can help get rid of the air that was  put into your GI tract during the procedure and reduce the bloating. If you had a lower endoscopy (such as a colonoscopy or flexible sigmoidoscopy) you may notice spotting of blood in your stool or on the toilet paper. If you underwent a bowel prep for your procedure, you may not have a normal bowel movement for a few days.  Please Note:  You might notice some irritation and congestion in your nose or some drainage.  This is from the oxygen used during your procedure.  There is no need for concern and it should clear up in a day or so.  SYMPTOMS TO REPORT IMMEDIATELY:  Following lower endoscopy (colonoscopy or flexible sigmoidoscopy):  Excessive amounts of blood in the stool  Significant tenderness or worsening of abdominal pains  Swelling of the abdomen that is new, acute  Fever of 100F or higher  For urgent or emergent issues, a gastroenterologist can be reached at any hour by calling [phone]. Do not use MyChart messaging for urgent concerns.    DIET:  We do recommend a small meal at first, but then you may proceed to your regular diet.  Drink plenty of fluids but you should avoid alcoholic beverages for 24 hours.  ACTIVITY:  You should plan to take it easy for the rest of today and you should NOT DRIVE or use heavy machinery until tomorrow (because of the sedation medicines used during the test).    FOLLOW UP: Our staff will call the number listed on your records the next business day following your procedure.  We will call around 7:15- 8:00 am to check on you and address any questions or concerns that you may have regarding the information given to you following your procedure. If we do not reach you, we will leave a message.     If any biopsies were taken you will be contacted by phone or by letter within the next 1-3 weeks.  Please call us at [phone] if you have not heard about the biopsies in 3 weeks.    SIGNATURES/CONFIDENTIALITY: You and/or your care partner have  signed paperwork which will be entered into your electronic medical record.  These signatures attest to the fact that that the information above on your After Visit Summary has been reviewed and is understood.  Full responsibility of the confidentiality of this discharge information lies with you and/or your care-partner.

## 2023-02-09 NOTE — Op Note (Signed)
[facility] Patient Name: Vanessa [name] Procedure Date: 02/09/2023 9:54 AM MRN: [id number] Endoscopist: Iva Boop , MD, [id number] Age: 47 Referring MD:  Date of Birth: 01-15-1976 Gender: Female Account #: [id number] Procedure:                Colonoscopy Indications:              Screening for colorectal malignant neoplasm, This                            is the patient's first colonoscopy Medicines:                Monitored Anesthesia Care Procedure:                Pre-Anesthesia Assessment:                           - Prior to the procedure, a History and Physical                            was performed, and patient medications and                            allergies were reviewed. The patient's tolerance of                            previous anesthesia was also reviewed. The risks                            and benefits of the procedure and the sedation                            options and risks were discussed with the patient.                            All questions were answered, and informed consent                            was obtained. Prior Anticoagulants: The patient has                            taken no anticoagulant or antiplatelet agents. ASA                            Grade Assessment: II - A patient with mild systemic                            disease. After reviewing the risks and benefits,                            the patient was deemed in satisfactory condition to                            undergo the procedure.  After obtaining informed consent, the colonoscope                            was passed under direct vision. Throughout the                            procedure, the patient's blood pressure, pulse, and                            oxygen saturations were monitored continuously. The                            PCF-HQ190L Colonoscope [id number] was introduced                            through the anus and advanced  to the the cecum,                            identified by appendiceal orifice and ileocecal                            valve. The colonoscopy was performed without                            difficulty. The patient tolerated the procedure                            well. The quality of the bowel preparation was                            excellent. The ileocecal valve, appendiceal                            orifice, and rectum were photographed. The bowel                            preparation used was SUPREP via split dose                            instruction. Scope In: 10:18:40 AM Scope Out: 10:32:46 AM Scope Withdrawal Time: 0 hours 8 minutes 18 seconds  Total Procedure Duration: 0 hours 14 minutes 6 seconds  Findings:                 The perianal and digital rectal examinations were                            normal.                           A diminutive polyp was found in the descending                            colon. The polyp was sessile. The polyp was removed  with a cold snare. Resection and retrieval were                            complete. Verification of patient identification                            for the specimen was done. Estimated blood loss was                            minimal.                           The exam was otherwise without abnormality on                            direct and retroflexion views. Complications:            No immediate complications. Estimated Blood Loss:     Estimated blood loss was minimal. Impression:               - One diminutive polyp in the descending colon,                            removed with a cold snare. Resected and retrieved.                           - The examination was otherwise normal on direct                            and retroflexion views. Recommendation:           - Patient has a contact number available for                            emergencies. The signs and symptoms of  potential                            delayed complications were discussed with the                            patient. Return to normal activities tomorrow.                            Written discharge instructions were provided to the                            patient.                           - Resume previous diet.                           - Continue present medications.                           - Repeat colonoscopy is recommended. The  colonoscopy date will be determined after pathology                            results from today's exam become available for                            review. Iva Boop, MD 02/09/2023 10:42:56 AM This report has been signed electronically.

## 2023-02-10 ENCOUNTER — Telehealth: Payer: Self-pay | Admitting: *Deleted

## 2023-02-10 NOTE — Telephone Encounter (Signed)
  Follow up Call-     02/09/2023    9:32 AM  Call back number  Post procedure Call Back phone  # [phone]  Permission to leave phone message Yes     Patient questions:  Do you have a fever, pain , or abdominal swelling? No. Pain Score  0 *  Have you tolerated food without any problems? Yes.    Have you been able to return to your normal activities? Yes.    Do you have any questions about your discharge instructions: Diet   No. Medications  No. Follow up visit  No.  Do you have questions or concerns about your Care? No.  Actions: * If pain score is 4 or above: No action needed, pain <4.

## 2023-02-16 ENCOUNTER — Ambulatory Visit: Payer: Commercial Managed Care - PPO

## 2023-02-22 ENCOUNTER — Ambulatory Visit: Payer: Commercial Managed Care - PPO | Attending: Orthopaedic Surgery | Admitting: Physical Therapy

## 2023-02-22 ENCOUNTER — Encounter: Payer: Self-pay | Admitting: Internal Medicine

## 2023-02-22 DIAGNOSIS — R252 Cramp and spasm: Secondary | ICD-10-CM | POA: Diagnosis present

## 2023-02-22 DIAGNOSIS — M25561 Pain in right knee: Secondary | ICD-10-CM | POA: Diagnosis present

## 2023-02-22 DIAGNOSIS — M5459 Other low back pain: Secondary | ICD-10-CM | POA: Diagnosis present

## 2023-02-22 NOTE — Therapy (Signed)
OUTPATIENT PHYSICAL THERAPY TREATMENT/Evaluation   Patient Name: MADISON HERZOG MRN: [id number] DOB:October 22, 1975, 47 y.o., female Today's Date: 02/22/2023  END OF SESSION:  PT End of Session - 02/22/23 1621     Visit Number 3    Number of Visits 6    Date for PT Re-Evaluation 03/09/23    Authorization Type [employer]: [facility]    PT Start Time 1615    PT Stop Time 1706    PT Time Calculation (min) 51 min    Activity Tolerance Patient tolerated treatment well    Behavior During Therapy WFL for tasks assessed/performed             Past Medical History:  Diagnosis Date   Abnormal Pap smear    AMA (advanced maternal age) multigravida 35+    Anxiety    Hx of varicella    Seasonal allergies    Past Surgical History:  Procedure Laterality Date   AUGMENTATION MAMMAPLASTY Bilateral    BACK SURGERY     C5, C6   BREAST ENHANCEMENT SURGERY     LEEP     NASAL SEPTOPLASTY W/ TURBINOPLASTY Bilateral 03/08/2016   Procedure: BILATERAL NASAL SEPTOPLASTY WITH TURBINATE REDUCTION;  Surgeon: Drema Halon, MD;  Location: [facility];  Service: ENT;  Laterality: Bilateral;   NASAL SINUS SURGERY Bilateral 03/08/2016   Procedure: TOTAL BILATERAL ETHMOIDECTOMY AND MAXILLARY OSTEA ENLARGEMENT;  Surgeon: Drema Halon, MD;  Location: [facility];  Service: ENT;  Laterality: Bilateral;   SHOULDER ARTHROSCOPY Right 2022   Patient Active Problem List   Diagnosis Date Noted   Anxiety and depression 12/23/2021   Shoulder subluxation, right, initial encounter 01/05/2021   Traumatic incomplete tear of right rotator cuff 01/05/2021   SVD (spontaneous vaginal delivery) 01/22/2013   Postpartum care following vaginal delivery (5/6) 01/22/2013    PCP: Clayborne Dana, NP  REFERRING PROVIDER: Bjorn Pippin, MD (for knee)  REFERRING DIAG: M48.07  Foraminal Stenosis of Lumbosacral region  (Reffering Provider Lonna Cobb, MD)  R knee tibial Plateau  fracture  Rationale for Evaluation and Treatment: Rehabilitation  THERAPY DIAG:  Acute pain of right knee  Other low back pain  Cramp and spasm  ONSET DATE: last February 2023  SUBJECTIVE:                                                                                                                                                                                           SUBJECTIVE STATEMENT: Patient reports back has been doing great.  She had some pain after sitting on bleachers, resolved the next day.  Her knee problem  continues to get worse, she is now having more pain in the side of her knee, like its bruised, as well as the back of her knee.  She hasn't run or anything, just rode stationary bike 20 min.  "I don't need surgery do I?"  She fracture the tibial plateau of her knee when her dog bumped into her, told it would heal on its own.    PERTINENT HISTORY:  From MD notes on 01/11/23 "Patient states she has had an abdominal/ventral for many years now. She saw surgery team many years ago, but decided to hold off due to mesh recalls at the time. She is now having some mild discomfort when bending, exercising, etc and would like to have another consult to discuss repair. She denies any severe pain, redness. States the area is easily reducible.    She also mentions a fracture to her knee several weeks ago after her dog bumped into her while they were throwing frisbee outside. States it was not a hard hit and she did not fall or have any other trauma so she is wondering why that would've fractures so easily. She also reports having a shoulder injury with fracture several years ago that she didn't think was traumatic enough to cause a fracture. 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 Yes: NPRS scale: 5-6/10 Pain location: R knee posterior and lateral now Pain description: feels bruised Aggravating factors: not sure Relieving factors: nothing  PRECAUTIONS: None  WEIGHT BEARING RESTRICTIONS: No  FALLS:  Has patient fallen in last 6 months? Yes. 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   NEXT MD VISIT: none scheduled, follow-up for hernia repair on June 30th  OBJECTIVE:   DIAGNOSTIC FINDINGS:  MRI 12/07/22 demonstrates mild lumbar scoliosis/rotation, grade 1 spondylolisthesis at L5-S1 with L5 spondylolysis.  Foraminal stenosis at L5-S1 on Right.  Other levels are fairly unremarkable.   Imaging of knee not available  PATIENT SURVEYS:  Modified Oswestry 19/50 = 38% impairment  FOTO 54%, predicted outcome 66%  SCREENING FOR RED FLAGS: Bowel or bladder incontinence: No Spinal tumors: No Cauda equina syndrome: No Compression fracture: No Abdominal aneurysm: No  COGNITION: Overall cognitive status: Within functional limits for tasks assessed     SENSATION: WFL  MUSCLE LENGTH: Hamstrings: Right 120 deg; Left 120 deg Quadriceps: Right : limited by knee injury; Left heel to buttock in prone.   POSTURE: increased lumbar lordosis  PALPATION: Increased spasm in R lumbar paraspinals, tenderness R SIJ  LUMBAR ROM:   AROM eval  Flexion To floor, tightness  Extension 50% limited, p!  Right lateral flexion Past knee  Left lateral flexion Past knee  Right rotation WNL  Left rotation WNL   (Blank rows = not tested)   Knee Evaluation 02/22/2023   LOWER EXTREMITY ROM:     Passive   Right 02/22/23 Left 02/22/23  Ankle dorsiflexion 10 5  Ankle plantarflexion 50 50  Ankle inversion 45 50  Ankle eversion 30 20   (Blank rows = not tested)   LOWER EXTREMITY MMT:   5/5 bil LE strength    LUMBAR SPECIAL TESTS:  Straight leg raise test: Negative, Sacral thrust negative  LOWER EXTREMITY SPECIAL TESTS:  02/22/23 Knee special tests: Anterior drawer test: negative, Posterior drawer test: negative, Lachman Test: negative, and McMurray's test: negative   GAIT: Distance walked: 100' Assistive device utilized: None Level of assistance: Complete Independence Comments: 02/22/23- no pronation, high arches, good alignment, only 2 toes visible, no gait deviations.       TODAY'S TREATMENT:                                                                                                                              DATE:   02/22/2023 Manual Therapy: to decrease muscle spasm and pain and improve mobility STM/TPR to R knee hamstrings, peroneal muscles, popliteus, fibular mobs, skilled palpation and monitoring during dry needling. Trigger Point Dry-Needling  Treatment instructions: Expect mild to moderate muscle soreness. S/S of pneumothorax if dry needled over a lung field, and to seek immediate medical attention should they occur. Patient verbalized understanding of these instructions and education. Patient Consent Given: Yes Education handout provided: Previously provided Muscles treated: R fibularis muscles, R lateral hamstring, R popliteus Electrical stimulation performed: No Parameters: N/A Treatment response/outcome: Twitch Response Elicited and Palpable Increase in Muscle Length  02/02/2023 Therapeutic Exercise: to improve strength and mobility.  Demo, verbal and tactile cues throughout for technique. Bike L1 x 5 min  -reassured bike good choice for rehabbing from knee injuries as non-weightbearing Manual Therapy: to decrease muscle spasm and pain and improve mobility STM/TPR to L  lumbar paraspinals, R peroneals, R hamstrings ,UPA mobs L lumbar spine,  skilled palpation and monitoring during dry needling.  KT taping to R ankle to decrease swelling (lantern pattern).  Trigger Point Dry-Needling  Treatment instructions: Expect mild to moderate muscle soreness. S/S of pneumothorax if dry needled over a lung field, and to seek immediate medical attention should they occur. Patient verbalized understanding of these instructions and education. Patient Consent Given: Yes Education handout provided: Previously provided Muscles treated: L L1-2 lumbar multifidi, R peroneals, L lateral hamstring. Electrical stimulation performed: No Parameters: N/A Treatment response/outcome: Palpable Increase in Muscle Length  01/26/2023 Manual Therapy: to decrease muscle spasm and pain and improve mobility STM/TPR to R lumbar paraspinals, UPA mobs R lumbar spine,  skilled palpation and monitoring during dry needling. Trigger Point Dry-Needling  Treatment instructions: Expect mild to moderate muscle soreness. S/S of pneumothorax if dry needled over a lung field, and to seek immediate medical attention should they occur. Patient verbalized understanding of these instructions and education. Patient Consent Given: Yes Education handout provided: Yes Muscles treated: R L3-5 lumbar multifidi Electrical stimulation performed: No Parameters: N/A Treatment response/outcome: Palpable Increase in Muscle Length  PATIENT EDUCATION:  Education details: KT taping Person educated: Patient Education method: Chief Technology Officer Education comprehension:  verbalized understanding  HOME EXERCISE PROGRAM: TBD  ASSESSMENT:  CLINICAL IMPRESSION: Today evaluated Cooper Render R knee, as her back has improved significantly, but her R knee has worsened.  She had a tibial plateau fracture a couple of months ago, but this did not involve articular surface.  Ligaments were intact and no signs of meniscus injury.   She was tender primarily in posterior lateral aspect of knee, reporting some pain with fibular head mob, and with palpation of popliteus muscle.  Noted cavus type feet bil, with decreased 1st ray mobility, but otherwise her ankle alignment was very good and no gait deviations that would contribute to knee pain.  She consented to trial of manual therapy including TrDN along trigger points found in fibularis muscles and popliteus, reported leg felt "lighter" afterwards.  Cooper Render continues to demonstrate potential for improvement and would benefit from continued skilled therapy to address impairments.     OBJECTIVE IMPAIRMENTS: decreased activity tolerance, difficulty walking, increased fascial restrictions, increased muscle spasms, postural dysfunction, and pain.   ACTIVITY LIMITATIONS: carrying, lifting, bending, sitting, and locomotion level  PARTICIPATION LIMITATIONS: driving, community activity, occupation, and leisure activities like running  PERSONAL FACTORS: 3+ comorbidities: lumbar foraminal stenosis, lumbar spondylosis, anxiety, depression, abdominal hernia, history breast augmentation, history R RCR , R knee fracture, ACDF  are also affecting patient's functional outcome.   REHAB POTENTIAL: Good  CLINICAL DECISION MAKING: Evolving/moderate complexity  EVALUATION COMPLEXITY: Moderate   GOALS: Goals reviewed with patient? Yes  SHORT TERM GOALS: Target date: 02/09/2023   Patient will be independent with initial HEP.  Baseline: needs  Goal status: IN PROGRESS  LONG TERM GOALS: Target date: 03/09/2023  extended to 04/05/23 for new knee goals.    Patient will be independent with advanced/ongoing HEP to improve outcomes and carryover.  Baseline:  Goal status: IN PROGRESS  2.  Patient will report 75% improvement in low back pain to improve QOL.  Baseline:  Goal status: IN PROGRESS  3.  Patient will demonstrate full pain free lumbar ROM to perform ADLs.   Baseline: see  objective  Goal status: IN PROGRESS  4.  Patient will be able to walk > 4 miles without increased low back pain to start training for marathon. Baseline: increased LBP after 3 miles Goal status: IN PROGRESS  5.  Patient will report 57 on lumbar FOTO to demonstrate improved functional ability.  Baseline: 54 Goal status: IN PROGRESS   6.  Patient will tolerate sitting > 60 minutes without LBP to travel.  Baseline: burns, painful for more than an hour Goal status: IN PROGRESS  7.  Patient will report 75% improvement in R knee pain.  Baseline:  Goal status: INITIAL  8.  Patient will demonstrate R quad extensibility = L to demonstrate decreased tightness.  Baseline: R side limited  Goal status: INITIAL  PLAN:  PT FREQUENCY: 1-2x/week  PT DURATION: 6 weeks  PLANNED INTERVENTIONS: Therapeutic exercises, Therapeutic activity, Neuromuscular re-education, Balance training, Gait training, Patient/Family education, Self Care, Joint mobilization, Stair training, Orthotic/Fit training, Aquatic Therapy, Dry Needling, Electrical stimulation, Spinal mobilization, Cryotherapy, Moist heat, Traction, Ultrasound, Ionotophoresis 4mg /ml Dexamethasone, Manual therapy, and Re-evaluation.  PLAN FOR NEXT SESSION: check back goals, measure R quad length, knee flexion ROM bil   Jena Gauss, PT, DPT  02/22/2023, 6:42 PM

## 2023-02-23 ENCOUNTER — Ambulatory Visit: Payer: Commercial Managed Care - PPO | Admitting: Physical Therapy

## 2023-02-23 ENCOUNTER — Encounter: Payer: Self-pay | Admitting: Physical Therapy

## 2023-02-23 ENCOUNTER — Ambulatory Visit: Payer: Commercial Managed Care - PPO | Attending: Orthopaedic Surgery | Admitting: Physical Therapy

## 2023-02-23 DIAGNOSIS — M25561 Pain in right knee: Secondary | ICD-10-CM | POA: Diagnosis not present

## 2023-02-23 DIAGNOSIS — F4323 Adjustment disorder with mixed anxiety and depressed mood: Secondary | ICD-10-CM | POA: Diagnosis not present

## 2023-02-23 DIAGNOSIS — R252 Cramp and spasm: Secondary | ICD-10-CM | POA: Diagnosis not present

## 2023-02-23 DIAGNOSIS — M5459 Other low back pain: Secondary | ICD-10-CM | POA: Insufficient documentation

## 2023-02-23 NOTE — Therapy (Addendum)
OUTPATIENT PHYSICAL THERAPY TREATMENT   Patient Name: Vanessa [name] MRN: [id number] DOB:April 07, 1976, 47 y.o., female Today's Date: 02/23/2023  END OF SESSION:  PT End of Session - 02/23/23 1546     Visit Number 4    Number of Visits 8    Date for PT Re-Evaluation 04/05/23    Authorization Type [employer]: [facility]    PT Start Time 1535    PT Stop Time 1625    PT Time Calculation (min) 50 min    Activity Tolerance Patient tolerated treatment well    Behavior During Therapy WFL for tasks assessed/performed             Past Medical History:  Diagnosis Date   Abnormal Pap smear    AMA (advanced maternal age) multigravida 35+    Anxiety    Hx of varicella    Seasonal allergies    Past Surgical History:  Procedure Laterality Date   AUGMENTATION MAMMAPLASTY Bilateral    BACK SURGERY     C5, C6   BREAST ENHANCEMENT SURGERY     LEEP     NASAL SEPTOPLASTY W/ TURBINOPLASTY Bilateral 03/08/2016   Procedure: BILATERAL NASAL SEPTOPLASTY WITH TURBINATE REDUCTION;  Surgeon: Drema Halon, MD;  Location: [facility];  Service: ENT;  Laterality: Bilateral;   NASAL SINUS SURGERY Bilateral 03/08/2016   Procedure: TOTAL BILATERAL ETHMOIDECTOMY AND MAXILLARY OSTEA ENLARGEMENT;  Surgeon: Drema Halon, MD;  Location: [facility];  Service: ENT;  Laterality: Bilateral;   SHOULDER ARTHROSCOPY Right 2022   Patient Active Problem List   Diagnosis Date Noted   Anxiety and depression 12/23/2021   Shoulder subluxation, right, initial encounter 01/05/2021   Traumatic incomplete tear of right rotator cuff 01/05/2021   SVD (spontaneous vaginal delivery) 01/22/2013   Postpartum care following vaginal delivery (5/6) 01/22/2013    PCP: Clayborne Dana, NP  REFERRING PROVIDER: Bjorn Pippin, MD (for knee)  REFERRING DIAG: M48.07  Foraminal Stenosis of Lumbosacral region  (Reffering Provider Lonna Cobb, MD)  R knee tibial Plateau fracture  Rationale for  Evaluation and Treatment: Rehabilitation  THERAPY DIAG:  Other low back pain  Cramp and spasm  Acute pain of right knee  ONSET DATE: last February 2023  SUBJECTIVE:                                                                                                                                                                                           SUBJECTIVE STATEMENT: Knee feels better, whole leg feels lighter today.  Not as much pain in the back/side of knee.    PERTINENT HISTORY:  From MD notes on 01/11/23 "Patient states she has had an abdominal/ventral for many years now. She saw surgery team many years ago, but decided to hold off due to mesh recalls at the time. She is now having some mild discomfort when bending, exercising, etc and would like to have another consult to discuss repair. She denies any severe pain, redness. States the area is easily reducible.    She also mentions a fracture to her knee several weeks ago after her dog bumped into her while they were throwing frisbee outside. States it was not a hard hit and she did not fall or have any other trauma so she is wondering why that would've fractures so easily. She also reports having a shoulder injury with fracture several years ago that she didn't think was traumatic enough to cause a fracture. 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 Yes: NPRS scale: 3-4/10 Pain location: R knee posterior and lateral now Pain description: feels bruised Aggravating factors: not sure Relieving factors: nothing  PRECAUTIONS: None  WEIGHT BEARING RESTRICTIONS: No  FALLS:  Has patient fallen in last 6 months? Yes. 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   NEXT MD VISIT: none scheduled, follow-up for hernia repair on June 30th  OBJECTIVE:   DIAGNOSTIC FINDINGS:  MRI 12/07/22 demonstrates mild lumbar scoliosis/rotation, grade 1 spondylolisthesis at L5-S1 with L5 spondylolysis.  Foraminal stenosis at L5-S1 on Right.  Other levels are fairly unremarkable.   Imaging of knee not available  PATIENT SURVEYS:  Modified Oswestry 19/50 = 38% impairment  FOTO 54%, predicted outcome 66%  SCREENING FOR RED FLAGS: Bowel or bladder incontinence: No Spinal tumors: No Cauda equina syndrome: No Compression fracture: No Abdominal aneurysm: No  COGNITION: Overall cognitive status: Within functional limits for tasks assessed     SENSATION: WFL  MUSCLE LENGTH: Hamstrings: Right 120 deg; Left 120 deg Quadriceps: Right : limited by knee injury; Left heel to buttock in prone.   POSTURE: increased lumbar lordosis  PALPATION: Increased spasm in R lumbar paraspinals, tenderness R SIJ  LUMBAR ROM:   AROM eval  Flexion To floor, tightness  Extension 50% limited, p!  Right lateral flexion Past knee  Left lateral flexion Past knee  Right rotation WNL  Left rotation WNL   (Blank rows = not tested)   Knee Evaluation 02/22/2023   LOWER EXTREMITY ROM:     Passive  Right 02/22/23 Left 02/22/23  Knee flexion 145 155  Ankle dorsiflexion 10 5  Ankle plantarflexion 50 50  Ankle inversion 45 50  Ankle eversion 30 20   (Blank rows = not tested)   LOWER EXTREMITY MMT:   5/5 bil LE strength    LUMBAR SPECIAL TESTS:  Straight leg raise test: Negative, Sacral thrust negative  LOWER EXTREMITY SPECIAL TESTS:  02/22/23 Knee special tests: Anterior drawer  test: negative, Posterior drawer test: negative, Lachman Test: negative, and McMurray's test: negative   GAIT: Distance walked: 100' Assistive  device utilized: None Level of assistance: Complete Independence Comments: 02/22/23- no pronation, high arches, good alignment, only 2 toes visible, no gait deviations.       TODAY'S TREATMENT:                                                                                                                              DATE:   02/23/23 Therapeutic Exercise: to improve strength and mobility.  Demo, verbal and tactile cues throughout for technique. Ellipitcal L1 x 6 min SLR LLE x 30  S/l Hip abduction 2 x 20 LLE Hip extension  2 x 20 bil  S/L Hip adduction 2 x 20 LLE Bridges 2 x 10 Squats x 10 - discussion various forms of squats Quad stretch in standing - LLE Manual Therapy: to decrease muscle spasm and pain and improve mobility IASTM with s/s edge tools to lateral hamstrings, adductors, quads, STM to poplitues Therapeutic Activity:  assessing knee ROM and back goals.    02/22/2023 Manual Therapy: to decrease muscle spasm and pain and improve mobility STM/TPR to R knee hamstrings, peroneal muscles, popliteus, fibular mobs, skilled palpation and monitoring during dry needling. Trigger Point Dry-Needling  Treatment instructions: Expect mild to moderate muscle soreness. S/S of pneumothorax if dry needled over a lung field, and to seek immediate medical attention should they occur. Patient verbalized understanding of these instructions and education. Patient Consent Given: Yes Education handout provided: Previously provided Muscles treated: R fibularis muscles, R lateral hamstring, R popliteus Electrical stimulation performed: No Parameters: N/A Treatment response/outcome: Twitch Response Elicited and Palpable Increase in Muscle Length  02/02/2023 Therapeutic Exercise: to improve strength and mobility.  Demo, verbal and tactile cues throughout for  technique. Bike L1 x 5 min  -reassured bike good choice for rehabbing from knee injuries as non-weightbearing Manual Therapy: to decrease muscle spasm and pain and improve mobility STM/TPR to L lumbar paraspinals, R peroneals, R hamstrings ,UPA mobs L lumbar spine,  skilled palpation and monitoring during dry needling.  KT taping to R ankle to decrease swelling (lantern pattern).  Trigger Point Dry-Needling  Treatment instructions: Expect mild to moderate muscle soreness. S/S of pneumothorax if dry needled over a lung field, and to seek immediate medical attention should they occur. Patient verbalized understanding of these instructions and education. Patient Consent Given: Yes Education handout provided: Previously provided Muscles treated: L L1-2 lumbar multifidi, R peroneals, L lateral hamstring. Electrical stimulation performed: No Parameters: N/A Treatment response/outcome: Palpable Increase in Muscle Length  01/26/2023 Manual Therapy: to decrease muscle spasm and pain and improve mobility STM/TPR to R lumbar paraspinals, UPA mobs R lumbar spine,  skilled palpation and monitoring during dry needling. Trigger Point Dry-Needling  Treatment instructions: Expect mild to moderate muscle soreness. S/S of pneumothorax if dry needled over a lung field, and to seek immediate medical attention should they occur. Patient verbalized understanding of these instructions and education. Patient Consent Given: Yes Education handout provided: Yes Muscles treated:  R L3-5 lumbar multifidi Electrical stimulation performed: No Parameters: N/A Treatment response/outcome: Palpable Increase in Muscle Length  PATIENT EDUCATION:  Education details: HEP Person educated: Patient Education method: Explanation and Handouts Education comprehension: verbalized understanding  HOME EXERCISE PROGRAM: Access Code: [email or web address] URL: [email or web address] Date: 02/23/2023 Prepared by: Harrie Foreman  Exercises - Supine Active Straight Leg Raise  - 1 x daily - 7 x weekly - 3 sets - 20 reps - Sidelying Hip Abduction  - 1 x daily - 7 x weekly - 3 sets - 20 reps - Prone Hip Extension with Plantarflexion  - 1 x daily - 7 x weekly - 3 sets - 20 reps - Sidelying Hip Adduction  - 1 x daily - 7 x weekly - 3 sets - 20 reps - Supine Bridge  - 1 x daily - 7 x weekly - 3 sets - 10 reps  ASSESSMENT:  CLINICAL IMPRESSION: Today focused on establishing HEP for hip strengthening to start conditioning [facility] for more activity, as she has stopped exercise to allow knee to heal.  She demonstrated tightness in R quads and decreased ROM in R knee compared to L.  She was challenged with today's exercises but able to complete without pain.  Reported decreased tightness and improved tolerance to quad stretch following manual therapy.   Cooper Render continues to demonstrate potential for improvement and would benefit from continued skilled therapy to address impairments.     OBJECTIVE IMPAIRMENTS: decreased activity tolerance, difficulty walking, increased fascial restrictions, increased muscle spasms, postural dysfunction, and pain.   ACTIVITY LIMITATIONS: carrying, lifting, bending, sitting, and locomotion level  PARTICIPATION LIMITATIONS: driving, community activity, occupation, and leisure activities like running  PERSONAL FACTORS: 3+ comorbidities: lumbar foraminal stenosis, lumbar spondylosis, anxiety, depression, abdominal hernia, history breast augmentation, history R RCR , R knee fracture, ACDF  are also affecting patient's functional outcome.   REHAB POTENTIAL: Good  CLINICAL DECISION MAKING: Evolving/moderate complexity  EVALUATION COMPLEXITY: Moderate   GOALS: Goals reviewed with patient? Yes  SHORT TERM GOALS: Target date: 02/09/2023   Patient will be independent with initial HEP.  Baseline: needs  Goal status: IN PROGRESS 02/23/23 -given  LONG TERM GOALS: Target date: 03/09/2023   extended to 04/05/23 for new knee goals.    Patient will be independent with advanced/ongoing HEP to improve outcomes and carryover.  Baseline:  Goal status: IN PROGRESS  2.  Patient will report 75% improvement in low back pain to improve QOL.  Baseline:  Goal status: IN PROGRESS  3.  Patient will demonstrate full pain free lumbar ROM to perform ADLs.   Baseline: see objective  Goal status: IN PROGRESS  4.  Patient will be able to walk > 4 miles without increased low back pain to start training for marathon. Baseline: increased LBP after 3 miles Goal status: IN PROGRESS 02/23/23- has not started walking yet due to knee  5.  Patient will report 32 on lumbar FOTO to demonstrate improved functional ability.  Baseline: 54 Goal status: IN PROGRESS   6.  Patient will tolerate sitting > 60 minutes without LBP to travel.  Baseline: burns, painful for more than an hour Goal status: MET 02/23/23- no pain with sitting.    7.  Patient will report 75% improvement in R knee pain.  Baseline:  Goal status: IN PROGRESS  8.  Patient will demonstrate R quad extensibility = L to demonstrate decreased tightness.  Baseline: R side limited 12 cm compared to Left Goal status: IN PROGRESS  PLAN:  PT FREQUENCY: 1-2x/week  PT DURATION: 6 weeks  PLANNED INTERVENTIONS: Therapeutic exercises, Therapeutic activity, Neuromuscular re-education, Balance training, Gait training, Patient/Family education, Self Care, Joint mobilization, Stair training, Orthotic/Fit training, Aquatic Therapy, Dry Needling, Electrical stimulation, Spinal mobilization, Cryotherapy, Moist heat, Traction, Ultrasound, Ionotophoresis 4mg /ml Dexamethasone, Manual therapy, and Re-evaluation.  PLAN FOR NEXT SESSION: check remaining back goals, continue LE/hip/core strengthening, manual/TrDN to back, knee as appropriate  Jena Gauss, PT, DPT  02/23/2023, 6:23 PM   PHYSICAL THERAPY DISCHARGE SUMMARY  Visits from Start of Care:  4  Current functional level related to goals / functional outcomes: Decreased R knee and back pain   Remaining deficits: Decreased activity tolerance, not returned to running    Education / Equipment: HEP  Plan: Patient agrees to discharge.  Patient goals were not met. Patient is being discharged due to being unable to attend therapy visits due to work schedule.  She would require new order as has been more than 30 days since being seen.    Jena Gauss, PT 04/17/2023 3:44 PM

## 2023-03-02 ENCOUNTER — Ambulatory Visit: Payer: Commercial Managed Care - PPO | Admitting: Physical Therapy

## 2023-03-09 ENCOUNTER — Ambulatory Visit: Payer: Commercial Managed Care - PPO | Admitting: Physical Therapy

## 2023-03-16 DIAGNOSIS — K429 Umbilical hernia without obstruction or gangrene: Secondary | ICD-10-CM | POA: Diagnosis not present

## 2023-03-22 ENCOUNTER — Encounter: Payer: Self-pay | Admitting: Family Medicine

## 2023-03-22 ENCOUNTER — Other Ambulatory Visit: Payer: Self-pay

## 2023-03-22 ENCOUNTER — Other Ambulatory Visit (HOSPITAL_BASED_OUTPATIENT_CLINIC_OR_DEPARTMENT_OTHER): Payer: Self-pay

## 2023-03-22 ENCOUNTER — Ambulatory Visit: Payer: Commercial Managed Care - PPO | Admitting: Family Medicine

## 2023-03-22 VITALS — BP 128/84 | HR 95 | Temp 98.1°F | Resp 20 | Ht 66.0 in | Wt 136.0 lb

## 2023-03-22 DIAGNOSIS — F419 Anxiety disorder, unspecified: Secondary | ICD-10-CM

## 2023-03-22 DIAGNOSIS — L237 Allergic contact dermatitis due to plants, except food: Secondary | ICD-10-CM

## 2023-03-22 DIAGNOSIS — F32A Depression, unspecified: Secondary | ICD-10-CM | POA: Diagnosis not present

## 2023-03-22 MED ORDER — PREDNISONE 10 MG PO TABS
ORAL_TABLET | ORAL | 0 refills | Status: DC
Start: 2023-03-22 — End: 2023-03-22

## 2023-03-22 MED ORDER — FLUOXETINE HCL 20 MG PO CAPS
20.0000 mg | ORAL_CAPSULE | Freq: Every day | ORAL | 1 refills | Status: DC
Start: 2023-03-22 — End: 2023-10-01
  Filled 2023-03-22: qty 90, 90d supply, fill #0
  Filled 2023-06-15: qty 90, 90d supply, fill #1

## 2023-03-22 MED ORDER — PREDNISONE 10 MG PO TABS
ORAL_TABLET | ORAL | 0 refills | Status: AC
Start: 2023-03-22 — End: 2023-04-03
  Filled 2023-03-22: qty 30, 12d supply, fill #0

## 2023-03-22 MED ORDER — MELOXICAM 15 MG PO TABS
15.0000 mg | ORAL_TABLET | Freq: Every day | ORAL | 2 refills | Status: AC
  Filled 2023-03-22: qty 30, 30d supply, fill #0
  Filled 2023-06-15: qty 30, 30d supply, fill #1
  Filled 2023-07-10: qty 30, 30d supply, fill #2

## 2023-03-22 NOTE — Progress Notes (Signed)
Acute Office Visit  Subjective:     Patient ID: Vanessa [name], female    DOB: [birth year], 47 y.o.   MRN: [id number]  Chief Complaint  Patient presents with   Increase Prozac    HPI Patient is in today for mood follow-up and rash.  Discussed the use of AI scribe software for clinical note transcription with the patient, who gave verbal consent to proceed.  History of Present Illness   The patient reports an improvement in their mood symptoms after self-adjusting their Prozac dosage from an unspecified amount to 20mg . They request an official increase in their prescription to this dosage. They note no side effects from the increased dosage, but mention a possible decrease in libido, which they are attempting to manage through cognitive reframing.  Secondly, the patient reports a recent exposure to poison ivy or oak, leading to a widespread rash. Initially, they self-treated with prednisone found at home, which seemed to resolve the rash. However, the rash has since reappeared on their abdomen and leg. They took 40mg  of prednisone today. They have also been using calamine lotion and Benadryl for symptomatic relief.         ROS All review of systems negative except what is listed in the HPI      Objective:    BP 128/84 (BP Location: Right Arm, Patient Position: Sitting, Cuff Size: Normal)   Pulse 95   Temp 98.1 F (36.7 C) (Oral)   Resp 20   Ht 5\' 6"  (1.676 m)   Wt 136 lb (61.7 kg)   SpO2 98%   BMI 21.95 kg/m    Physical Exam Vitals reviewed.  Constitutional:      Appearance: Normal appearance.  Skin:    General: Skin is warm and dry.     Comments: Poison ivy dermatitis to lower abdomen and right leg  Neurological:     Mental Status: She is alert and oriented to person, place, and time.  Psychiatric:        Mood and Affect: Mood normal.        Behavior: Behavior normal.        Thought Content: Thought content normal.        Judgment: Judgment normal.     No  results found for any visits on 03/22/23.      Assessment & Plan:   Problem List Items Addressed This Visit     Anxiety and depression    Mood improved with increasing to Prozac 20 mg. Sending in refill. No SI/HI. Continue lifestyle measures.       Relevant Medications   FLUoxetine (PROZAC) 20 MG capsule   Other Visit Diagnoses     Poison ivy dermatitis    -  Primary Extended prednisone taper to prevent rebound rash. Patient has tolerated well in the past. Stop if any adverse reactions develop. Continue supportive meausrs.     Relevant Medications   predniSONE (DELTASONE) 10 MG tablet                 Meds ordered this encounter  Medications   FLUoxetine (PROZAC) 20 MG capsule    Sig: Take 1 capsule (20 mg total) by mouth daily.    Dispense:  90 capsule    Refill:  1    Order Specific Question:   Supervising Provider    Answer:   Danise Edge A [[id number]]   predniSONE (DELTASONE) 10 MG tablet    Sig: 4 tabs po daily x 3 days  then 3 tabs po daily x 3 days then 2 tabs po daily x 3 days then 1 tab po daily x 3 days then STOP    Dispense:  30 tablet    Refill:  0    Order Specific Question:   Supervising Provider    Answer:   Danise Edge A [[id number]]    Return if symptoms worsen or fail to improve.  Clayborne Dana, NP

## 2023-03-22 NOTE — Assessment & Plan Note (Signed)
Mood improved with increasing to Prozac 20 mg. Sending in refill. No SI/HI. Continue lifestyle measures.

## 2023-03-22 NOTE — Addendum Note (Signed)
Addended by: Hyman Hopes B on: 03/22/2023 03:41 PM   Modules accepted: Orders

## 2023-03-29 ENCOUNTER — Other Ambulatory Visit: Payer: Self-pay | Admitting: Oncology

## 2023-03-29 DIAGNOSIS — Z006 Encounter for examination for normal comparison and control in clinical research program: Secondary | ICD-10-CM

## 2023-03-30 DIAGNOSIS — F4323 Adjustment disorder with mixed anxiety and depressed mood: Secondary | ICD-10-CM | POA: Diagnosis not present

## 2023-04-13 DIAGNOSIS — Z1231 Encounter for screening mammogram for malignant neoplasm of breast: Secondary | ICD-10-CM | POA: Diagnosis not present

## 2023-04-13 LAB — HM MAMMOGRAPHY

## 2023-04-27 ENCOUNTER — Institutional Professional Consult (permissible substitution): Payer: Commercial Managed Care - PPO | Admitting: Plastic Surgery

## 2023-05-04 ENCOUNTER — Other Ambulatory Visit (HOSPITAL_BASED_OUTPATIENT_CLINIC_OR_DEPARTMENT_OTHER): Payer: Self-pay

## 2023-05-04 DIAGNOSIS — Z1331 Encounter for screening for depression: Secondary | ICD-10-CM | POA: Diagnosis not present

## 2023-05-04 DIAGNOSIS — Z01419 Encounter for gynecological examination (general) (routine) without abnormal findings: Secondary | ICD-10-CM | POA: Diagnosis not present

## 2023-05-04 DIAGNOSIS — Z124 Encounter for screening for malignant neoplasm of cervix: Secondary | ICD-10-CM | POA: Diagnosis not present

## 2023-05-04 DIAGNOSIS — Z1239 Encounter for other screening for malignant neoplasm of breast: Secondary | ICD-10-CM | POA: Diagnosis not present

## 2023-05-04 DIAGNOSIS — Z309 Encounter for contraceptive management, unspecified: Secondary | ICD-10-CM | POA: Diagnosis not present

## 2023-05-04 DIAGNOSIS — Z113 Encounter for screening for infections with a predominantly sexual mode of transmission: Secondary | ICD-10-CM | POA: Diagnosis not present

## 2023-05-04 DIAGNOSIS — Z1211 Encounter for screening for malignant neoplasm of colon: Secondary | ICD-10-CM | POA: Diagnosis not present

## 2023-05-04 DIAGNOSIS — Z139 Encounter for screening, unspecified: Secondary | ICD-10-CM | POA: Diagnosis not present

## 2023-05-04 MED ORDER — LEVONORGEST-ETH ESTRAD 91-DAY 0.15-0.03 MG PO TABS
1.0000 | ORAL_TABLET | Freq: Every day | ORAL | 4 refills | Status: DC
  Filled 2023-05-11: qty 91, 91d supply, fill #0
  Filled 2023-05-29: qty 91, 84d supply, fill #0
  Filled 2023-06-15 – 2023-09-10 (×2): qty 91, 84d supply, fill #1
  Filled 2023-12-20: qty 91, 84d supply, fill #2
  Filled 2024-03-12: qty 91, 84d supply, fill #3
  Filled 2024-04-18: qty 91, 84d supply, fill #4

## 2023-05-11 ENCOUNTER — Other Ambulatory Visit (HOSPITAL_BASED_OUTPATIENT_CLINIC_OR_DEPARTMENT_OTHER): Payer: Self-pay

## 2023-05-29 ENCOUNTER — Other Ambulatory Visit (HOSPITAL_BASED_OUTPATIENT_CLINIC_OR_DEPARTMENT_OTHER): Payer: Self-pay

## 2023-05-29 ENCOUNTER — Other Ambulatory Visit: Payer: Self-pay

## 2023-05-30 ENCOUNTER — Other Ambulatory Visit (HOSPITAL_BASED_OUTPATIENT_CLINIC_OR_DEPARTMENT_OTHER): Payer: Self-pay

## 2023-06-01 DIAGNOSIS — F4323 Adjustment disorder with mixed anxiety and depressed mood: Secondary | ICD-10-CM | POA: Diagnosis not present

## 2023-06-15 ENCOUNTER — Other Ambulatory Visit: Payer: Self-pay

## 2023-06-16 ENCOUNTER — Ambulatory Visit: Payer: Self-pay | Admitting: Plastic Surgery

## 2023-06-16 ENCOUNTER — Encounter: Payer: Self-pay | Admitting: Plastic Surgery

## 2023-06-16 ENCOUNTER — Telehealth: Payer: Self-pay

## 2023-06-16 VITALS — BP 136/85 | HR 96 | Ht 66.0 in | Wt 129.4 lb

## 2023-06-16 DIAGNOSIS — Z719 Counseling, unspecified: Secondary | ICD-10-CM | POA: Insufficient documentation

## 2023-06-16 NOTE — Progress Notes (Signed)
Patient ID: Vanessa [name], female    DOB: 22-Jun-1976, 47 y.o.   MRN: [id number]   Chief Complaint  Patient presents with   Advice Only   Breast Problem   Skin Problem    The patient is a 47 year old female here for evaluation of her abdomen and her breasts.  The patient had saline implants placed with Dr. Odis Luster approximately 10 years ago.  She believes they are between 350 and 400 cc.  She believes she has a card at home and she will take a picture of that for [place].  She would like to have those exchanged and likely a mastopexy because she does have a little bit of ptosis.  Her measurement from her sternal notch to her nipple is 23 cm on the left and 24 cm on the right.  She is 5 feet 6 inches tall weighs 129 pounds.  She is also been diagnosed with abdominal hernia as a general surgeon she is planning on having that fixed in the next few months from [place].  She was wondering if could all be done at the same time we talked about the increased risk to do it all at the same time especially when an implant is involved.  But she would like an abdominoplasty at the same time if that is possible.  I do feel the hernia is.    Review of Systems  Constitutional: Negative.   HENT: Negative.    Eyes: Negative.   Respiratory: Negative.    Cardiovascular: Negative.   Endocrine: Negative.   Genitourinary: Negative.   Musculoskeletal: Negative.     Past Medical History:  Diagnosis Date   Abnormal Pap smear    AMA (advanced maternal age) multigravida 35+    Anxiety    Hx of varicella    Seasonal allergies     Past Surgical History:  Procedure Laterality Date   AUGMENTATION MAMMAPLASTY Bilateral    BACK SURGERY     C5, C6   BREAST ENHANCEMENT SURGERY     LEEP     NASAL SEPTOPLASTY W/ TURBINOPLASTY Bilateral 03/08/2016   Procedure: BILATERAL NASAL SEPTOPLASTY WITH TURBINATE REDUCTION;  Surgeon: Drema Halon, MD;  Location: [facility];  Service: ENT;   Laterality: Bilateral;   NASAL SINUS SURGERY Bilateral 03/08/2016   Procedure: TOTAL BILATERAL ETHMOIDECTOMY AND MAXILLARY OSTEA ENLARGEMENT;  Surgeon: Drema Halon, MD;  Location: [facility];  Service: ENT;  Laterality: Bilateral;   SHOULDER ARTHROSCOPY Right 2022      Current Outpatient Medications:    FLUoxetine (PROZAC) 20 MG capsule, Take 1 capsule (20 mg total) by mouth daily., Disp: 90 capsule, Rfl: 1   ibuprofen (ADVIL,MOTRIN) 200 MG tablet, Take 200 mg by mouth every 6 (six) hours as needed., Disp: , Rfl:    levonorgestrel-ethinyl estradiol (SETLAKIN) 0.15-0.03 MG tablet, Take 1 tablet by mouth daily., Disp: 91 tablet, Rfl: 4   meloxicam (MOBIC) 15 MG tablet, Take 1 tablet (15 mg total) by mouth daily., Disp: 30 tablet, Rfl: 2   Multiple Vitamin (MULTIVITAMIN ADULT PO), Take 1 tablet by mouth daily., Disp: , Rfl:    Objective:   Vitals:   06/16/23 1047  BP: 136/85  Pulse: 96  SpO2: 97%    Physical Exam Constitutional:      Appearance: Normal appearance.  HENT:     Head: Atraumatic.  Cardiovascular:     Rate and Rhythm: Normal rate.     Pulses: Normal pulses.  Pulmonary:     Effort: Pulmonary effort is normal.  Abdominal:     General: There is no distension.     Palpations: Abdomen is soft. There is no mass.     Hernia: A hernia is present.  Musculoskeletal:        General: No tenderness.  Skin:    General: Skin is warm.     Capillary Refill: Capillary refill takes less than 2 seconds.  Neurological:     Mental Status: She is alert and oriented to person, place, and time.  Psychiatric:        Mood and Affect: Mood normal.        Behavior: Behavior normal.        Thought Content: Thought content normal.        Judgment: Judgment normal.     Assessment & Plan:  Encounter for counseling  The patient is a candidate for removal and replacement of breast implants.  I would recommend not going any larger.  She does need a mastopexy.  I  think that would fix the droop that she notices and does not like.  Abdominoplasty with direct repair of hernia is nice option.  I will send her quotes and we will work with [place] on getting this scheduled.  Pictures were obtained of the patient and placed in the chart with the patient's or guardian's permission.   Alena Bills Kaulana Brindle, DO

## 2023-06-16 NOTE — Telephone Encounter (Signed)
Faxed release of information form to [facility] to request mammogram results. Received fax success confirmation. Forwarded ROI form to front desk for batch scanning.

## 2023-06-29 NOTE — Telephone Encounter (Signed)
Received mammogram report. Will send to batch scanning.

## 2023-07-04 ENCOUNTER — Encounter (HOSPITAL_COMMUNITY): Payer: Self-pay

## 2023-07-04 ENCOUNTER — Other Ambulatory Visit (HOSPITAL_BASED_OUTPATIENT_CLINIC_OR_DEPARTMENT_OTHER): Payer: Self-pay

## 2023-07-04 ENCOUNTER — Emergency Department (HOSPITAL_COMMUNITY)
Admission: EM | Admit: 2023-07-04 | Discharge: 2023-07-04 | Disposition: A | Discharge status: Home / Self Care | Payer: Commercial Managed Care - PPO | Attending: Emergency Medicine | Admitting: Emergency Medicine

## 2023-07-04 DIAGNOSIS — M546 Pain in thoracic spine: Secondary | ICD-10-CM | POA: Diagnosis not present

## 2023-07-04 DIAGNOSIS — M549 Dorsalgia, unspecified: Secondary | ICD-10-CM | POA: Diagnosis present

## 2023-07-04 MED ORDER — PREDNISONE 20 MG PO TABS: ORAL_TABLET | ORAL | 0 refills | Status: AC

## 2023-07-04 MED ORDER — KETOROLAC TROMETHAMINE 15 MG/ML IJ SOLN
15.0000 mg | Freq: Once | INTRAMUSCULAR | Status: AC
  Administered 2023-07-04: 15 mg via INTRAMUSCULAR
  Filled 2023-07-04: qty 1

## 2023-07-04 MED ORDER — CYCLOBENZAPRINE HCL 10 MG PO TABS
10.0000 mg | ORAL_TABLET | Freq: Three times a day (TID) | ORAL | 0 refills | Status: DC | PRN
  Filled 2023-07-04: qty 50, 17d supply, fill #0

## 2023-07-04 MED ORDER — CYCLOBENZAPRINE HCL 10 MG PO TABS: 10.0000 mg | ORAL_TABLET | Freq: Two times a day (BID) | ORAL | 0 refills | Status: AC | PRN

## 2023-07-04 NOTE — Discharge Instructions (Signed)
Thank you for allowing [place] to be a part of your care today.  I have sent in a prescription for prednisone to treat your back pain flare.  Do not take naproxen or ibuprofen while taking prednisone as this increases your risk for adverse effects such as GI bleeding.  You may take 1000 mg of Tylenol every 6 hours.  DO NOT exceed 4000 mg of Tylenol in a 24-hour period.   I have also sent in a muscle relaxant to take at bedtime to help you get some rest.  Do not consume alcohol, drive, or perform other tasks while taking this medication as it may be sedating.   I encourage you to continue reaching out to your neurosurgeon to schedule a follow up appointment.  Return to the ED if you develop sudden worsening of your symptoms or if you have new concerns.

## 2023-07-04 NOTE — ED Provider Notes (Signed)
[place] EMERGENCY DEPARTMENT AT [facility] Provider Note   CSN: [id number] Arrival date & time: 07/04/23  1155     History  Chief Complaint  Patient presents with   Back Pain    Vanessa [name] is a 47 y.o. 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  Patient states when the pain began she initially thought she was "sore", but the pain has become debilitating today.  Patient reports that she had to "crawl out of bed".  She has worsening pain with positional changes and had to leave work due to pain.  Patient reports she took 80 mg of prednisone this morning because "that was all she had".  She also took ibuprofen without relief of symptoms.  Denies loss of bladder or bowel control, heavy lifting, falls, or trauma, weakness, numbness.        Home Medications Prior to Admission medications   Medication Sig Start Date End Date Taking? Authorizing Provider  cyclobenzaprine (FLEXERIL) 10 MG tablet Take 1 tablet (10 mg total) by mouth 2 (two) times daily as needed for muscle spasms. 07/04/23  Yes Chauntae Hults R, PA-C  predniSONE (DELTASONE) 20 MG tablet Take 3 tablets (60 mg total) by mouth daily with breakfast for 2 days, THEN 2 tablets (40 mg total) daily with breakfast for 4 days, THEN 1 tablet (20 mg total) daily with breakfast for 3 days. 07/04/23 07/13/23 Yes Laya Letendre R, PA-C  FLUoxetine (PROZAC) 20 MG capsule Take 1 capsule (20 mg total) by mouth daily. 03/22/23   Clayborne Dana, NP  ibuprofen (ADVIL,MOTRIN) 200 MG tablet Take 200 mg by mouth every 6 (six) hours as needed.    [provider]  levonorgestrel-ethinyl estradiol (SETLAKIN) 0.15-0.03 MG tablet Take 1 tablet by mouth daily. 05/04/23     meloxicam (MOBIC) 15 MG tablet Take 1 tablet (15 mg total) by mouth daily. 03/22/23     Multiple Vitamin (MULTIVITAMIN ADULT PO) Take 1 tablet by mouth  daily.    [provider]      Allergies    Penicillins, Sulfa antibiotics, and Misc. sulfonamide containing compounds    Review of Systems   Review of Systems  Musculoskeletal:  Positive for back pain. Negative for gait problem and neck pain.  Neurological:  Negative for weakness and numbness.    Physical Exam Updated Vital Signs BP 129/88 (BP Location: Left Arm)   Pulse 81   Temp 98.3 F (36.8 C) (Oral)   Resp 16   LMP 06/13/2023   SpO2 99%  Physical Exam Vitals and nursing note reviewed.  Constitutional:      General: She is not in acute distress.    Appearance: Normal appearance. She is not ill-appearing or diaphoretic.  Cardiovascular:     Rate and Rhythm: Normal rate and regular rhythm.  Pulmonary:     Effort: Pulmonary effort is normal.  Musculoskeletal:     Cervical back: Normal.     Thoracic back: Tenderness and bony tenderness present. No deformity, signs of trauma or spasms. Decreased range of motion.     Lumbar back: Normal. No tenderness or bony tenderness.     Comments: Midline and bilateral paraspinal tenderness to lower thoracic spine.  Skin:    General: Skin is warm and dry.     Capillary Refill: Capillary refill takes less than 2 seconds.  Neurological:  Mental Status: She is alert. Mental status is at baseline.  Psychiatric:        Mood and Affect: Mood normal.        Behavior: Behavior normal.     ED Results / Procedures / Treatments   Labs (all labs ordered are listed, but only abnormal results are displayed) Labs Reviewed - No data to display  EKG None  Radiology No results found.  Procedures Procedures    Medications Ordered in ED Medications  ketorolac (TORADOL) 15 MG/ML injection 15 mg (15 mg Intramuscular Given 07/04/23 1319)    ED Course/ Medical Decision Making/ A&P                                 Medical Decision Making Risk Prescription drug management.   This patient presents to the ED with chief  complaint(s) of back pain with pertinent past medical history of pars defect.  The complaint involves an extensive differential diagnosis and also carries with it a high risk of complications and morbidity.    The differential diagnosis includes acute on chronic back pain   Initial Assessment:   Exam significant for midline and bilateral paraspinal tenderness to lower thoracic spine.  Decreased ROM.  No tenderness to cervical or lumbar spine.  Gait intact.  No palpable muscle spasms.  No obvious deformities or trauma.   Treatment and Reassessment: Patient given IM injection Toradol for back pain.   Disposition:   Will send patient home on short course of steroids and muscle relaxant to treat acute on chronic back pain flare.  Advised patient to follow up with neurosurgery if symptoms persist.  I do not feel that emergent imaging is required at this time.  Patient's symptoms gradually worsened and without obvious trauma.  She is not at increased risk for fracture and has no red flags of back pain.    The patient has been appropriately medically screened and/or stabilized in the ED. I have low suspicion for any other emergent medical condition which would require further screening, evaluation or treatment in the ED or require inpatient management. At time of discharge the patient is hemodynamically stable and in no acute distress. I have discussed work-up results and diagnosis with patient and answered all questions. Patient is agreeable with discharge plan. We discussed strict return precautions for returning to the emergency department and they verbalized understanding.             Final Clinical Impression(s) / ED Diagnoses Final diagnoses:  Acute midline thoracic back pain    Rx / DC Orders ED Discharge Orders          Ordered    predniSONE (DELTASONE) 20 MG tablet  Q breakfast        07/04/23 1256    cyclobenzaprine (FLEXERIL) 10 MG tablet  2 times daily PRN        07/04/23  1256              Dell Hurtubise, Lindie Spruce R, PA-C 07/04/23 1348    Anders Simmonds T, DO 07/05/23 0845

## 2023-07-04 NOTE — ED Triage Notes (Signed)
Pt reports chronic back pain, lower, non radiating. Denies any recent injuries. Was exacerbated two days ago by running and today got really bad. Denies bowel or bladder changes. This morning endorses taking 80mg  of prednisone.

## 2023-07-10 ENCOUNTER — Other Ambulatory Visit (HOSPITAL_BASED_OUTPATIENT_CLINIC_OR_DEPARTMENT_OTHER): Payer: Self-pay

## 2023-07-13 DIAGNOSIS — M4317 Spondylolisthesis, lumbosacral region: Secondary | ICD-10-CM | POA: Diagnosis not present

## 2023-07-13 DIAGNOSIS — K439 Ventral hernia without obstruction or gangrene: Secondary | ICD-10-CM | POA: Diagnosis not present

## 2023-07-13 DIAGNOSIS — M4306 Spondylolysis, lumbar region: Secondary | ICD-10-CM | POA: Diagnosis not present

## 2023-07-13 DIAGNOSIS — K429 Umbilical hernia without obstruction or gangrene: Secondary | ICD-10-CM | POA: Diagnosis not present

## 2023-07-14 ENCOUNTER — Other Ambulatory Visit (HOSPITAL_BASED_OUTPATIENT_CLINIC_OR_DEPARTMENT_OTHER): Payer: Self-pay

## 2023-07-14 DIAGNOSIS — M4696 Unspecified inflammatory spondylopathy, lumbar region: Secondary | ICD-10-CM | POA: Diagnosis not present

## 2023-07-14 MED ORDER — METHOCARBAMOL 500 MG PO TABS
500.0000 mg | ORAL_TABLET | Freq: Four times a day (QID) | ORAL | 2 refills | Status: AC
  Filled 2023-07-14: qty 60, 8d supply, fill #0
  Filled 2023-09-10: qty 60, 8d supply, fill #1
  Filled 2023-10-01: qty 60, 8d supply, fill #2

## 2023-07-18 ENCOUNTER — Other Ambulatory Visit (HOSPITAL_BASED_OUTPATIENT_CLINIC_OR_DEPARTMENT_OTHER): Payer: Self-pay

## 2023-08-02 ENCOUNTER — Inpatient Hospital Stay: Admission: RE | Admit: 2023-08-02 | Payer: Commercial Managed Care - PPO | Source: Ambulatory Visit

## 2023-08-02 ENCOUNTER — Other Ambulatory Visit: Payer: Self-pay | Admitting: Family Medicine

## 2023-08-02 DIAGNOSIS — T07XXXA Unspecified multiple injuries, initial encounter: Secondary | ICD-10-CM

## 2023-08-02 DIAGNOSIS — N951 Menopausal and female climacteric states: Secondary | ICD-10-CM

## 2023-09-10 ENCOUNTER — Telehealth: Payer: Commercial Managed Care - PPO | Admitting: Family Medicine

## 2023-09-10 DIAGNOSIS — J020 Streptococcal pharyngitis: Secondary | ICD-10-CM | POA: Diagnosis not present

## 2023-09-10 DIAGNOSIS — Z20818 Contact with and (suspected) exposure to other bacterial communicable diseases: Secondary | ICD-10-CM | POA: Diagnosis not present

## 2023-09-10 MED ORDER — AZITHROMYCIN 250 MG PO TABS
ORAL_TABLET | ORAL | 0 refills | Status: AC
Start: 2023-09-10 — End: 2023-09-16
  Filled 2023-09-10: qty 6, 5d supply, fill #0

## 2023-09-10 NOTE — Progress Notes (Signed)
E-Visit for Sore Throat - Strep Symptoms  We are sorry that you are not feeling well.  Here is how we plan to help!  Based on what you have shared with me it is likely that you have strep pharyngitis.  Strep pharyngitis is inflammation and infection in the back of the throat.  This is an infection cause by bacteria and is treated with antibiotics.  I have prescribed Azithromycin 250 mg two tablets today and then one daily for 4 additional days. For throat pain, we recommend over the counter oral pain relief medications such as acetaminophen or aspirin, or anti-inflammatory medications such as ibuprofen or naproxen sodium. Topical treatments such as oral throat lozenges or sprays may be used as needed. Strep infections are not as easily transmitted as other respiratory infections, however we still recommend that you avoid close contact with loved ones, especially the very young and elderly.  Remember to wash your hands thoroughly throughout the day as this is the number one way to prevent the spread of infection and wipe down door knobs and counters with disinfectant.   Home Care: Only take medications as instructed by your medical team. Complete the entire course of an antibiotic. Do not take these medications with alcohol. A steam or ultrasonic humidifier can help congestion.  You can place a towel over your head and breathe in the steam from hot water coming from a faucet. Avoid close contacts especially the very young and the elderly. Cover your mouth when you cough or sneeze. Always remember to wash your hands.  Get Help Right Away If: You develop worsening fever or sinus pain. You develop a severe head ache or visual changes. Your symptoms persist after you have completed your treatment plan.  Make sure you Understand these instructions. Will watch your condition. Will get help right away if you are not doing well or get worse.   Thank you for choosing an e-visit.  Your e-visit  answers were reviewed by a board certified advanced clinical practitioner to complete your personal care plan. Depending upon the condition, your plan could have included both over the counter or prescription medications.  Please review your pharmacy choice. Make sure the pharmacy is open so you can pick up prescription now. If there is a problem, you may contact your provider through [employer] and have the prescription routed to another pharmacy.  Your safety is important to [place]. If you have drug allergies check your prescription carefully.   For the next 24 hours you can use MyChart to ask questions about today's visit, request a non-urgent call back, or ask for a work or school excuse. You will get an email in the next two days asking about your experience. I hope that your e-visit has been valuable and will speed your recovery.    have provided 5 minutes of non face to face time during this encounter for chart review and documentation.

## 2023-09-11 ENCOUNTER — Other Ambulatory Visit (HOSPITAL_BASED_OUTPATIENT_CLINIC_OR_DEPARTMENT_OTHER): Payer: Self-pay

## 2023-10-01 ENCOUNTER — Telehealth: Payer: Commercial Managed Care - PPO

## 2023-10-01 ENCOUNTER — Other Ambulatory Visit: Payer: Self-pay | Admitting: Family Medicine

## 2023-10-01 DIAGNOSIS — B9689 Other specified bacterial agents as the cause of diseases classified elsewhere: Secondary | ICD-10-CM | POA: Diagnosis not present

## 2023-10-01 DIAGNOSIS — J019 Acute sinusitis, unspecified: Secondary | ICD-10-CM

## 2023-10-01 DIAGNOSIS — F32A Depression, unspecified: Secondary | ICD-10-CM

## 2023-10-02 ENCOUNTER — Other Ambulatory Visit (HOSPITAL_BASED_OUTPATIENT_CLINIC_OR_DEPARTMENT_OTHER): Payer: Self-pay

## 2023-10-02 ENCOUNTER — Other Ambulatory Visit: Payer: Self-pay

## 2023-10-02 MED ORDER — FLUOXETINE HCL 20 MG PO CAPS
20.0000 mg | ORAL_CAPSULE | Freq: Every day | ORAL | 0 refills | Status: DC
  Filled 2023-10-02: qty 30, 30d supply, fill #0

## 2023-10-02 MED ORDER — DOXYCYCLINE HYCLATE 100 MG PO TABS
100.0000 mg | ORAL_TABLET | Freq: Two times a day (BID) | ORAL | 0 refills | Status: DC
Start: 2023-10-02 — End: 2023-10-12
  Filled 2023-10-02: qty 20, 10d supply, fill #0

## 2023-10-02 NOTE — Progress Notes (Signed)

## 2023-10-11 ENCOUNTER — Telehealth: Payer: Commercial Managed Care - PPO | Admitting: Physician Assistant

## 2023-10-11 DIAGNOSIS — B999 Unspecified infectious disease: Secondary | ICD-10-CM

## 2023-10-11 NOTE — Progress Notes (Signed)
  Because you are having continued symptoms despite antibiotic treatment via e-visit/video visit, I feel your condition warrants further evaluation and I recommend that you be seen in a face-to-face visit.   NOTE: There will be NO CHARGE for this E-Visit   If you are having a true medical emergency, please call 911.     For an urgent face to face visit, [place] has multiple urgent care centers for your convenience.  Click the link below for the full list of locations and hours, walk-in wait times, appointment scheduling options and driving directions:  Urgent Care - [place], [place], [place], [place], [place], [place]  [place]     Your MyChart E-visit questionnaire answers were reviewed by a board certified advanced clinical practitioner to complete your personal care plan based on your specific symptoms.    Thank you for using e-Visits.

## 2023-10-12 ENCOUNTER — Ambulatory Visit: Payer: Commercial Managed Care - PPO | Admitting: Family Medicine

## 2023-10-12 VITALS — BP 135/76 | HR 94 | Temp 98.3°F | Ht 66.0 in | Wt 135.0 lb

## 2023-10-12 DIAGNOSIS — R7989 Other specified abnormal findings of blood chemistry: Secondary | ICD-10-CM

## 2023-10-12 DIAGNOSIS — J029 Acute pharyngitis, unspecified: Secondary | ICD-10-CM | POA: Diagnosis not present

## 2023-10-12 LAB — POCT RAPID STREP A (OFFICE): Rapid Strep A Screen: NEGATIVE

## 2023-10-12 NOTE — Progress Notes (Signed)
Acute Office Visit  Subjective:     Patient ID: Vanessa [name], female    DOB: 10-04-1975, 48 y.o.   MRN: [id number]  Chief Complaint  Patient presents with   Sore Throat    HPI Patient is in today for sore throat, URI symptoms.   Discussed the use of AI scribe software for clinical note transcription with the patient, who gave verbal consent to proceed.  History of Present Illness   The patient, with a history of sinusitis, presents with a month-long illness characterized by a sore throat, body aches, chills, occasional cough, and fatigue. They completed a course of azithromycin and doxycycline for green nasal discharge and a sore throat, but symptoms persisted. The sore throat has worsened, described as feeling like 'glass' upon swallowing, and is exacerbated by talking. They deny fever, headache, cough, shortness of breath, vomiting, and diarrhea. They report aching in the lymph nodes, particularly in the neck and under the armpits, but deny any noticeable swelling or tenderness. They have been testing negative for COVID-19 daily. They have a history of a possible penicillin allergy from childhood, but are unsure if it is a true allergy. They have not had a strep test during this illness.            ROS All review of systems negative except what is listed in the HPI      Objective:    BP 135/76   Pulse 94   Temp 98.3 F (36.8 C) (Oral)   Ht 5\' 6"  (1.676 m)   Wt 135 lb (61.2 kg)   SpO2 99%   BMI 21.79 kg/m    Physical Exam Vitals reviewed.  Constitutional:      Appearance: Normal appearance. She is well-developed.  HENT:     Head: Normocephalic and atraumatic.     Right Ear: Tympanic membrane normal.     Left Ear: Tympanic membrane normal.     Nose: No congestion.     Mouth/Throat:     Mouth: Mucous membranes are moist.     Pharynx: Posterior oropharyngeal erythema present.     Tonsils: No tonsillar exudate or tonsillar abscesses.  Neck:     Thyroid: No  thyromegaly.  Cardiovascular:     Rate and Rhythm: Normal rate and regular rhythm.     Heart sounds: Normal heart sounds.  Pulmonary:     Effort: Pulmonary effort is normal.     Breath sounds: Normal breath sounds.  Abdominal:     General: Bowel sounds are normal. There is no distension.     Palpations: Abdomen is soft.     Tenderness: There is no abdominal tenderness.  Lymphadenopathy:     Cervical: Cervical adenopathy present.  Skin:    General: Skin is warm and dry.  Neurological:     Mental Status: She is alert and oriented to person, place, and time.  Psychiatric:        Mood and Affect: Mood normal.        Behavior: Behavior normal.        Thought Content: Thought content normal.        Judgment: Judgment normal.        Results for orders placed or performed in visit on 10/12/23  POCT rapid strep A  Result Value Ref Range   Rapid Strep A Screen Negative Negative        Assessment & Plan:   Problem List Items Addressed This Visit   None Visit Diagnoses  Sore throat    -  Primary   Relevant Orders   CBC with Differential/Platelet   Epstein-Barr virus VCA antibody panel   POCT rapid strep A (Completed)   Basic metabolic panel   Culture, Group A Strep       Severe sore throat, body aches, chills, and fatigue for over a month. Recently completed a course of doxycycline for sinusitis and azithromycin for pharyngitis. No fever or shortness of breath.  -Rapid strep test - negative.  -Send throat culture to lab and order blood work  -Continue supportive measures         No orders of the defined types were placed in this encounter.   Return if symptoms worsen or fail to improve.  Clayborne Dana, NP

## 2023-10-13 LAB — CBC WITH DIFFERENTIAL/PLATELET
Basophils Absolute: 0 10*3/uL (ref 0.0–0.1)
Basophils Relative: 0.2 % (ref 0.0–3.0)
Eosinophils Absolute: 0 10*3/uL (ref 0.0–0.7)
Eosinophils Relative: 0.1 % (ref 0.0–5.0)
HCT: 38.7 % (ref 36.0–46.0)
Hemoglobin: 13.2 g/dL (ref 12.0–15.0)
Lymphocytes Relative: 12.2 % (ref 12.0–46.0)
Lymphs Abs: 0.7 10*3/uL (ref 0.7–4.0)
MCHC: 34 g/dL (ref 30.0–36.0)
MCV: 95.6 fL (ref 78.0–100.0)
Monocytes Absolute: 0.9 10*3/uL (ref 0.1–1.0)
Monocytes Relative: 15.7 % — ABNORMAL HIGH (ref 3.0–12.0)
Neutro Abs: 4.1 10*3/uL (ref 1.4–7.7)
Neutrophils Relative %: 71.8 % (ref 43.0–77.0)
Platelets: 253 10*3/uL (ref 150.0–400.0)
RBC: 4.05 Mil/uL (ref 3.87–5.11)
RDW: 13.3 % (ref 11.5–15.5)
WBC: 5.7 10*3/uL (ref 4.0–10.5)

## 2023-10-13 LAB — BASIC METABOLIC PANEL
BUN: 13 mg/dL (ref 6–23)
CO2: 26 meq/L (ref 19–32)
Calcium: 9.3 mg/dL (ref 8.4–10.5)
Chloride: 101 meq/L (ref 96–112)
Creatinine, Ser: 0.79 mg/dL (ref 0.40–1.20)
GFR: 88.94 mL/min (ref >60.00)
Glucose, Bld: 96 mg/dL (ref 70–99)
Potassium: 4.1 meq/L (ref 3.5–5.1)
Sodium: 137 meq/L (ref 135–145)

## 2023-10-14 LAB — CULTURE, GROUP A STREP
Micro Number: 15989079
SPECIMEN QUALITY:: ADEQUATE

## 2023-10-14 LAB — EPSTEIN-BARR VIRUS VCA ANTIBODY PANEL
EBV NA IgG: >600 U/mL — ABNORMAL HIGH
EBV VCA IgG: 230 U/mL — ABNORMAL HIGH
EBV VCA IgM: <36 U/mL

## 2023-10-15 ENCOUNTER — Encounter: Payer: Self-pay | Admitting: Family Medicine

## 2023-10-15 NOTE — Addendum Note (Signed)
Addended by: Clayborne Dana on: 10/15/2023 08:27 PM   Modules accepted: Orders

## 2023-10-30 ENCOUNTER — Encounter: Payer: Self-pay | Admitting: Family Medicine

## 2023-11-01 ENCOUNTER — Ambulatory Visit: Payer: Commercial Managed Care - PPO | Admitting: Family Medicine

## 2023-11-01 DIAGNOSIS — Z0000 Encounter for general adult medical examination without abnormal findings: Secondary | ICD-10-CM

## 2023-11-02 ENCOUNTER — Ambulatory Visit: Payer: Commercial Managed Care - PPO | Admitting: Family Medicine

## 2023-11-02 DIAGNOSIS — F32A Depression, unspecified: Secondary | ICD-10-CM | POA: Diagnosis not present

## 2023-11-02 DIAGNOSIS — R61 Generalized hyperhidrosis: Secondary | ICD-10-CM | POA: Diagnosis not present

## 2023-11-02 DIAGNOSIS — R6882 Decreased libido: Secondary | ICD-10-CM | POA: Diagnosis not present

## 2023-11-23 ENCOUNTER — Telehealth: Admitting: Family Medicine

## 2023-11-23 DIAGNOSIS — J020 Streptococcal pharyngitis: Secondary | ICD-10-CM

## 2023-11-23 DIAGNOSIS — Z20818 Contact with and (suspected) exposure to other bacterial communicable diseases: Secondary | ICD-10-CM | POA: Diagnosis not present

## 2023-11-23 MED ORDER — AZITHROMYCIN 250 MG PO TABS: ORAL_TABLET | ORAL | 0 refills | Status: AC

## 2023-11-23 NOTE — Progress Notes (Signed)
 E-Visit for Sore Throat - Strep Symptoms  We are sorry that you are not feeling well.  Here is how we plan to help!  Based on what you have shared with me it is likely that you have strep pharyngitis.  Strep pharyngitis is inflammation and infection in the back of the throat.  This is an infection cause by bacteria and is treated with antibiotics.  I have prescribed Azithromycin 250 mg two tablets today and then one daily for 4 additional days. For throat pain, we recommend over the counter oral pain relief medications such as acetaminophen or aspirin, or anti-inflammatory medications such as ibuprofen or naproxen sodium. Topical treatments such as oral throat lozenges or sprays may be used as needed. Strep infections are not as easily transmitted as other respiratory infections, however we still recommend that you avoid close contact with loved ones, especially the very young and elderly.  Remember to wash your hands thoroughly throughout the day as this is the number one way to prevent the spread of infection and wipe down door knobs and counters with disinfectant.   Home Care: Only take medications as instructed by your medical team. Complete the entire course of an antibiotic. Do not take these medications with alcohol. A steam or ultrasonic humidifier can help congestion.  You can place a towel over your head and breathe in the steam from hot water coming from a faucet. Avoid close contacts especially the very young and the elderly. Cover your mouth when you cough or sneeze. Always remember to wash your hands.  Get Help Right Away If: You develop worsening fever or sinus pain. You develop a severe head ache or visual changes. Your symptoms persist after you have completed your treatment plan.  Make sure you Understand these instructions. Will watch your condition. Will get help right away if you are not doing well or get worse.   Thank you for choosing an e-visit.  Your e-visit  answers were reviewed by a board certified advanced clinical practitioner to complete your personal care plan. Depending upon the condition, your plan could have included both over the counter or prescription medications.  Please review your pharmacy choice. Make sure the pharmacy is open so you can pick up prescription now. If there is a problem, you may contact your provider through [employer] and have the prescription routed to another pharmacy.  Your safety is important to [place]. If you have drug allergies check your prescription carefully.   For the next 24 hours you can use MyChart to ask questions about today's visit, request a non-urgent call back, or ask for a work or school excuse. You will get an email in the next two days asking about your experience. I hope that your e-visit has been valuable and will speed your recovery.    have provided 5 minutes of non face to face time during this encounter for chart review and documentation.

## 2023-11-30 ENCOUNTER — Other Ambulatory Visit (HOSPITAL_COMMUNITY): Payer: Self-pay | Attending: Oncology

## 2023-12-20 ENCOUNTER — Other Ambulatory Visit (HOSPITAL_BASED_OUTPATIENT_CLINIC_OR_DEPARTMENT_OTHER): Payer: Self-pay

## 2024-01-11 ENCOUNTER — Inpatient Hospital Stay: Admission: RE | Admit: 2024-01-11 | Source: Ambulatory Visit

## 2024-01-11 ENCOUNTER — Other Ambulatory Visit: Payer: Self-pay | Admitting: Family Medicine

## 2024-01-11 DIAGNOSIS — Z1231 Encounter for screening mammogram for malignant neoplasm of breast: Secondary | ICD-10-CM

## 2024-01-25 DIAGNOSIS — K429 Umbilical hernia without obstruction or gangrene: Secondary | ICD-10-CM | POA: Diagnosis not present

## 2024-02-08 DIAGNOSIS — F4323 Adjustment disorder with mixed anxiety and depressed mood: Secondary | ICD-10-CM | POA: Diagnosis not present

## 2024-02-15 DIAGNOSIS — F4323 Adjustment disorder with mixed anxiety and depressed mood: Secondary | ICD-10-CM | POA: Diagnosis not present

## 2024-02-22 DIAGNOSIS — F4323 Adjustment disorder with mixed anxiety and depressed mood: Secondary | ICD-10-CM | POA: Diagnosis not present

## 2024-03-12 ENCOUNTER — Other Ambulatory Visit: Payer: Self-pay

## 2024-03-12 ENCOUNTER — Other Ambulatory Visit (HOSPITAL_BASED_OUTPATIENT_CLINIC_OR_DEPARTMENT_OTHER): Payer: Self-pay

## 2024-03-12 ENCOUNTER — Other Ambulatory Visit: Payer: Self-pay | Admitting: Family Medicine

## 2024-03-12 DIAGNOSIS — F32A Depression, unspecified: Secondary | ICD-10-CM

## 2024-03-13 ENCOUNTER — Other Ambulatory Visit (HOSPITAL_BASED_OUTPATIENT_CLINIC_OR_DEPARTMENT_OTHER): Payer: Self-pay

## 2024-03-18 ENCOUNTER — Other Ambulatory Visit (HOSPITAL_BASED_OUTPATIENT_CLINIC_OR_DEPARTMENT_OTHER): Payer: Self-pay

## 2024-03-20 ENCOUNTER — Other Ambulatory Visit: Payer: Commercial Managed Care - PPO

## 2024-03-28 ENCOUNTER — Other Ambulatory Visit (HOSPITAL_BASED_OUTPATIENT_CLINIC_OR_DEPARTMENT_OTHER)

## 2024-04-01 ENCOUNTER — Other Ambulatory Visit (HOSPITAL_BASED_OUTPATIENT_CLINIC_OR_DEPARTMENT_OTHER): Payer: Self-pay

## 2024-04-01 ENCOUNTER — Other Ambulatory Visit: Payer: Self-pay | Admitting: Family Medicine

## 2024-04-01 DIAGNOSIS — F32A Depression, unspecified: Secondary | ICD-10-CM

## 2024-04-11 ENCOUNTER — Ambulatory Visit

## 2024-04-11 ENCOUNTER — Ambulatory Visit (HOSPITAL_BASED_OUTPATIENT_CLINIC_OR_DEPARTMENT_OTHER)
Admission: RE | Admit: 2024-04-11 | Discharge: 2024-04-11 | Disposition: A | Discharge status: Home / Self Care | Source: Ambulatory Visit | Attending: Family Medicine | Admitting: Family Medicine

## 2024-04-11 DIAGNOSIS — N959 Unspecified menopausal and perimenopausal disorder: Secondary | ICD-10-CM | POA: Diagnosis not present

## 2024-04-11 DIAGNOSIS — T07XXXA Unspecified multiple injuries, initial encounter: Secondary | ICD-10-CM | POA: Diagnosis not present

## 2024-04-11 DIAGNOSIS — N951 Menopausal and female climacteric states: Secondary | ICD-10-CM | POA: Diagnosis not present

## 2024-04-12 ENCOUNTER — Other Ambulatory Visit (HOSPITAL_BASED_OUTPATIENT_CLINIC_OR_DEPARTMENT_OTHER): Payer: Self-pay

## 2024-04-12 ENCOUNTER — Ambulatory Visit: Payer: Self-pay | Admitting: Family Medicine

## 2024-04-12 ENCOUNTER — Encounter: Payer: Self-pay | Admitting: *Deleted

## 2024-04-12 ENCOUNTER — Other Ambulatory Visit: Payer: Self-pay | Admitting: Family Medicine

## 2024-04-12 DIAGNOSIS — F32A Depression, unspecified: Secondary | ICD-10-CM

## 2024-04-12 MED ORDER — FLUOXETINE HCL 20 MG PO CAPS
20.0000 mg | ORAL_CAPSULE | Freq: Every day | ORAL | 0 refills | Status: DC
  Filled 2024-04-12: qty 30, 30d supply, fill #0

## 2024-04-18 ENCOUNTER — Ambulatory Visit
Admission: RE | Admit: 2024-04-18 | Discharge: 2024-04-18 | Disposition: A | Discharge status: Home / Self Care | Source: Ambulatory Visit | Attending: Family Medicine | Admitting: Family Medicine

## 2024-04-18 ENCOUNTER — Other Ambulatory Visit (HOSPITAL_BASED_OUTPATIENT_CLINIC_OR_DEPARTMENT_OTHER): Payer: Self-pay

## 2024-04-18 DIAGNOSIS — Z1231 Encounter for screening mammogram for malignant neoplasm of breast: Secondary | ICD-10-CM | POA: Diagnosis not present

## 2024-04-19 ENCOUNTER — Other Ambulatory Visit (HOSPITAL_BASED_OUTPATIENT_CLINIC_OR_DEPARTMENT_OTHER): Payer: Self-pay

## 2024-05-02 DIAGNOSIS — Z01812 Encounter for preprocedural laboratory examination: Secondary | ICD-10-CM | POA: Diagnosis not present

## 2024-05-13 ENCOUNTER — Other Ambulatory Visit: Payer: Self-pay | Admitting: Family Medicine

## 2024-05-13 DIAGNOSIS — F32A Depression, unspecified: Secondary | ICD-10-CM

## 2024-05-14 ENCOUNTER — Other Ambulatory Visit (HOSPITAL_BASED_OUTPATIENT_CLINIC_OR_DEPARTMENT_OTHER): Payer: Self-pay

## 2024-05-15 DIAGNOSIS — K429 Umbilical hernia without obstruction or gangrene: Secondary | ICD-10-CM | POA: Diagnosis not present

## 2024-05-22 DIAGNOSIS — Z1330 Encounter for screening examination for mental health and behavioral disorders, unspecified: Secondary | ICD-10-CM | POA: Diagnosis not present

## 2024-05-22 DIAGNOSIS — Z1231 Encounter for screening mammogram for malignant neoplasm of breast: Secondary | ICD-10-CM | POA: Diagnosis not present

## 2024-05-22 DIAGNOSIS — Z124 Encounter for screening for malignant neoplasm of cervix: Secondary | ICD-10-CM | POA: Diagnosis not present

## 2024-05-22 DIAGNOSIS — Z1211 Encounter for screening for malignant neoplasm of colon: Secondary | ICD-10-CM | POA: Diagnosis not present

## 2024-05-22 DIAGNOSIS — Z01419 Encounter for gynecological examination (general) (routine) without abnormal findings: Secondary | ICD-10-CM | POA: Diagnosis not present

## 2024-06-04 ENCOUNTER — Telehealth: Admitting: Physician Assistant

## 2024-06-04 ENCOUNTER — Encounter

## 2024-06-04 ENCOUNTER — Other Ambulatory Visit (HOSPITAL_BASED_OUTPATIENT_CLINIC_OR_DEPARTMENT_OTHER): Payer: Self-pay

## 2024-06-04 DIAGNOSIS — R21 Rash and other nonspecific skin eruption: Secondary | ICD-10-CM | POA: Diagnosis not present

## 2024-06-04 MED ORDER — DOXYCYCLINE HYCLATE 100 MG PO TABS
100.0000 mg | ORAL_TABLET | Freq: Two times a day (BID) | ORAL | 0 refills | Status: AC
  Filled 2024-06-04: qty 14, 7d supply, fill #0

## 2024-06-04 NOTE — Progress Notes (Signed)
 E Visit for Rash  We are sorry that you are not feeling well. Here is how we plan to help!  Based on what you have shared with me you have the start of a mild infection from potential spider bites. I am glad you are not having fever, chills or aches. I would continue to keep the area clean and dry. Ok to continue an OTC antihistamine like Claritin. I am adding on an antibiotic to take as directed. This has been sent to your pharmacy.   HOME CARE:  Take cool showers and avoid direct sunlight. Apply cool compress or wet dressings. Take a bath in an oatmeal bath.  Sprinkle content of one Aveeno packet under running faucet with comfortably warm water.  Bathe for 15-20 minutes, 1-2 times daily.  Pat dry with a towel. Do not rub the rash. Use hydrocortisone  cream. Take an antihistamine like Benadryl  for widespread rashes that itch.  The adult dose of Benadryl  is 25-50 mg by mouth 4 times daily. Caution:  This type of medication may cause sleepiness.  Do not drink alcohol, drive, or operate dangerous machinery while taking antihistamines.  Do not take these medications if you have prostate enlargement.  Read package instructions thoroughly on all medications that you take.  GET HELP RIGHT AWAY IF:  Symptoms don't go away after treatment. Severe itching that persists. If you rash spreads or swells. If you rash begins to smell. If it blisters and opens or develops a yellow-brown crust. You develop a fever. You have a sore throat. You become short of breath.  MAKE SURE YOU:  Understand these instructions. Will watch your condition. Will get help right away if you are not doing well or get worse.  Thank you for choosing an e-visit.  Your e-visit answers were reviewed by a board certified advanced clinical practitioner to complete your personal care plan. Depending upon the condition, your plan could have included both over the counter or prescription medications.  Please review your  pharmacy choice. Make sure the pharmacy is open so you can pick up prescription now. If there is a problem, you may contact your provider through [employer] and have the prescription routed to another pharmacy.  Your safety is important to us . If you have drug allergies check your prescription carefully.   For the next 24 hours you can use MyChart to ask questions about today's visit, request a non-urgent call back, or ask for a work or school excuse. You will get an email in the next two days asking about your experience. I hope that your e-visit has been valuable and will speed your recovery.

## 2024-06-04 NOTE — Progress Notes (Signed)
 I have spent 5 minutes in review of e-visit questionnaire, review and updating patient chart, medical decision making and response to patient.   Elsie Velma Lunger, PA-C

## 2024-06-06 ENCOUNTER — Other Ambulatory Visit (HOSPITAL_BASED_OUTPATIENT_CLINIC_OR_DEPARTMENT_OTHER): Payer: Self-pay

## 2024-06-13 DIAGNOSIS — Z4889 Encounter for other specified surgical aftercare: Secondary | ICD-10-CM | POA: Diagnosis not present

## 2024-07-21 ENCOUNTER — Other Ambulatory Visit: Payer: Self-pay | Admitting: Medical Genetics

## 2024-07-21 DIAGNOSIS — Z006 Encounter for examination for normal comparison and control in clinical research program: Secondary | ICD-10-CM

## 2024-07-23 ENCOUNTER — Other Ambulatory Visit (HOSPITAL_BASED_OUTPATIENT_CLINIC_OR_DEPARTMENT_OTHER): Payer: Self-pay

## 2024-07-23 ENCOUNTER — Telehealth: Payer: Self-pay | Admitting: Family Medicine

## 2024-07-23 DIAGNOSIS — F32A Depression, unspecified: Secondary | ICD-10-CM

## 2024-07-23 MED ORDER — LEVONORGEST-ETH ESTRAD 91-DAY 0.15-0.03 MG PO TABS
1.0000 | ORAL_TABLET | Freq: Every day | ORAL | 4 refills | Status: AC
  Filled 2024-07-23: qty 91, 91d supply, fill #0
  Filled 2024-09-01 – 2024-10-23 (×2): qty 91, 91d supply, fill #1

## 2024-07-23 MED ORDER — FLUOXETINE HCL 20 MG PO CAPS
20.0000 mg | ORAL_CAPSULE | Freq: Every day | ORAL | 0 refills | Status: AC
  Filled 2024-07-23: qty 30, 30d supply, fill #0

## 2024-07-23 NOTE — Telephone Encounter (Signed)
 Jannis- can you reach out to Pt to set up an appt w/ Waddell please?

## 2024-07-23 NOTE — Telephone Encounter (Signed)
 Got her scheduled

## 2024-07-25 ENCOUNTER — Ambulatory Visit: Admitting: Family Medicine

## 2024-08-01 DIAGNOSIS — F331 Major depressive disorder, recurrent, moderate: Secondary | ICD-10-CM | POA: Diagnosis not present

## 2024-08-01 DIAGNOSIS — F4310 Post-traumatic stress disorder, unspecified: Secondary | ICD-10-CM | POA: Diagnosis not present

## 2024-08-07 NOTE — Progress Notes (Incomplete)
 Established Patient Office Visit  Subjective:  Patient ID: Vanessa [name], female    DOB: 02/16/1976  Age: 48 y.o. MRN: [id number]  CC: No chief complaint on file.     HPI Vanessa [name] is here for mood follow-up.    Mood follow-up: - Diagnosis: anxiety and depression - Treatment: Prozac  20 mg daily - Medication side effects:  - SI/HI:  - Update:    Past Medical History:  Diagnosis Date   Abnormal Pap smear    AMA (advanced maternal age) multigravida 35+    Anxiety    Hx of varicella    Seasonal allergies     Past Surgical History:  Procedure Laterality Date   AUGMENTATION MAMMAPLASTY Bilateral    BACK SURGERY     C5, C6   BREAST ENHANCEMENT SURGERY     LEEP     NASAL SEPTOPLASTY W/ TURBINOPLASTY Bilateral 03/08/2016   Procedure: BILATERAL NASAL SEPTOPLASTY WITH TURBINATE REDUCTION;  Surgeon: Lonni FORBES Angle, MD;  Location: [facility];  Service: ENT;  Laterality: Bilateral;   NASAL SINUS SURGERY Bilateral 03/08/2016   Procedure: TOTAL BILATERAL ETHMOIDECTOMY AND MAXILLARY OSTEA ENLARGEMENT;  Surgeon: Lonni FORBES Angle, MD;  Location: [facility];  Service: ENT;  Laterality: Bilateral;   SHOULDER ARTHROSCOPY Right 2022    Family History  Problem Relation Age of Onset   Rheum arthritis Mother    Anemia Mother    Migraines Mother    Diabetes Father    Hypertension Father    Colon polyps Maternal Grandmother 61   Colon cancer Maternal Grandmother 72   Diabetes Maternal Grandmother    Stroke Maternal Grandfather    Heart disease Maternal Grandfather    Esophageal cancer Paternal Grandfather        smoker   COPD Paternal Grandfather    Stomach cancer Neg Hx    Rectal cancer Neg Hx     Social History   Socioeconomic History   Marital status: Divorced    Spouse name: Not on file   Number of children: Not on file   Years of education: Not on file   Highest education level: Bachelor's degree (e.g., BA, AB, BS)   Occupational History   Not on file  Tobacco Use   Smoking status: Former    Current packs/day: 0.00    Types: Cigarettes    Quit date: 11/24/1996    Years since quitting: 27.7   Smokeless tobacco: Never  Vaping Use   Vaping status: Never Used  Substance and Sexual Activity   Alcohol use: Yes    Alcohol/week: 0.0 - 2.0 standard drinks of alcohol   Drug use: No   Sexual activity: Yes    Birth control/protection: Pill  Other Topics Concern   Not on file  Social History Narrative   Not on file   Social Drivers of Health   Financial Resource Strain: Low Risk  (10/12/2023)   Overall Financial Resource Strain (CARDIA)    Difficulty of Paying Living Expenses: Not very hard  Food Insecurity: No Food Insecurity (10/12/2023)   Hunger Vital Sign    Worried About Running Out of Food in the Last Year: Never true    Ran Out of Food in the Last Year: Never true  Transportation Needs: No Transportation Needs (10/12/2023)   PRAPARE - Administrator, Civil Service (Medical): No    Lack of Transportation (Non-Medical): No  Physical Activity: Sufficiently Active (10/12/2023)   Exercise Vital Sign  Days of Exercise per Week: 5 days    Minutes of Exercise per Session: 30 min  Stress: Stress Concern Present (10/12/2023)   [employer] of Occupational Health - Occupational Stress Questionnaire    Feeling of Stress : To some extent  Social Connections: Moderately Isolated (10/12/2023)   Social Connection and Isolation Panel    Frequency of Communication with Friends and Family: Once a week    Frequency of Social Gatherings with Friends and Family: Twice a week    Attends Religious Services: 1 to 4 times per year    Active Member of [employer] or Organizations: No    Attends Engineer, Structural: Not on file    Marital Status: Separated  Intimate Partner Violence: Not on file    ROS All ROS negative except what is listed in the HPI.   Objective:   Today's Vitals: There  were no vitals taken for this visit.  Physical Exam  Assessment & Plan:   Problem List Items Addressed This Visit     Anxiety and depression - Primary      Follow-up: No follow-ups on file.   Waddell FURY Almarie, DNP, FNP-C  I,Emily Lagle,acting as a neurosurgeon for Waddell KATHEE Almarie, NP.,have documented all relevant documentation on the behalf of Waddell KATHEE Almarie, NP.  I, Waddell KATHEE Almarie, NP, have reviewed all documentation for this visit. The documentation on 08/08/2024 for the exam, diagnosis, procedures, and orders are all accurate and complete.

## 2024-08-08 ENCOUNTER — Ambulatory Visit: Admitting: Family Medicine

## 2024-08-08 DIAGNOSIS — F419 Anxiety disorder, unspecified: Secondary | ICD-10-CM

## 2024-08-19 ENCOUNTER — Ambulatory Visit: Admitting: Family Medicine

## 2024-08-19 DIAGNOSIS — F32A Depression, unspecified: Secondary | ICD-10-CM

## 2024-08-19 NOTE — Progress Notes (Incomplete)
 Established Patient Office Visit  Subjective:  Patient ID: Vanessa [name], female    DOB: [birth year]  Age: 48 y.o. MRN: [id number]  CC: No chief complaint on file.     HPI Vanessa [name] is here for routine follow-up.      Mood follow-up: - Diagnosis: Anxiety and Depression - Treatment: Prozac  20 mg daily  - Medication side effects:  - SI/HI:  - Update:      10/14/2022    1:10 PM 10/14/2022   12:56 PM 08/29/2022    2:53 PM 08/29/2022    1:45 PM 05/20/2022    2:34 PM  Depression screen PHQ 2/9  Decreased Interest 1 1 1  0 0  Down, Depressed, Hopeless 1 1 0 0 0  PHQ - 2 Score 2 2 1  0 0  Altered sleeping 1 1 0    Tired, decreased energy 3 3 1     Change in appetite 0 0 0    Feeling bad or failure about yourself  1 1 0    Trouble concentrating 0 0 1    Moving slowly or fidgety/restless 3 3 1     Suicidal thoughts 0 0 0    PHQ-9 Score 10  10  4      Difficult doing work/chores Very difficult Very difficult Not difficult at all       Data saved with a previous flowsheet row definition      10/14/2022    1:11 PM 10/14/2022   12:57 PM 08/29/2022    3:22 PM 08/29/2022    1:45 PM  GAD 7 : Generalized Anxiety Score  Nervous, Anxious, on Edge 0 0 2 0  Control/stop worrying 0 0 1 0  Worry too much - different things 0 0 1 0  Trouble relaxing 3 3 2  0  Restless 0 0 1 0  Easily annoyed or irritable 3 3 2  0  Afraid - awful might happen 0 0 0 0  Total GAD 7 Score 6 6 9  0  Anxiety Difficulty Very difficult Very difficult  Not difficult at all    Past Medical History:  Diagnosis Date   Abnormal Pap smear    AMA (advanced maternal age) multigravida 35+    Anxiety    Hx of varicella    Seasonal allergies     Past Surgical History:  Procedure Laterality Date   AUGMENTATION MAMMAPLASTY Bilateral    BACK SURGERY     C5, C6   BREAST ENHANCEMENT SURGERY     LEEP     NASAL SEPTOPLASTY W/ TURBINOPLASTY Bilateral 03/08/2016   Procedure: BILATERAL NASAL SEPTOPLASTY WITH  TURBINATE REDUCTION;  Surgeon: Lonni FORBES Angle, MD;  Location: [facility];  Service: ENT;  Laterality: Bilateral;   NASAL SINUS SURGERY Bilateral 03/08/2016   Procedure: TOTAL BILATERAL ETHMOIDECTOMY AND MAXILLARY OSTEA ENLARGEMENT;  Surgeon: Lonni FORBES Angle, MD;  Location: [facility];  Service: ENT;  Laterality: Bilateral;   SHOULDER ARTHROSCOPY Right 2022    Family History  Problem Relation Age of Onset   Rheum arthritis Mother    Anemia Mother    Migraines Mother    Diabetes Father    Hypertension Father    Colon polyps Maternal Grandmother 79   Colon cancer Maternal Grandmother 90   Diabetes Maternal Grandmother    Stroke Maternal Grandfather    Heart disease Maternal Grandfather    Esophageal cancer Paternal Grandfather        smoker   COPD Paternal Grandfather  Stomach cancer Neg Hx    Rectal cancer Neg Hx     Social History   Socioeconomic History   Marital status: Divorced    Spouse name: Not on file   Number of children: Not on file   Years of education: Not on file   Highest education level: Bachelor's degree (e.g., BA, AB, BS)  Occupational History   Not on file  Tobacco Use   Smoking status: Former    Current packs/day: 0.00    Types: Cigarettes    Quit date: 11/24/1996    Years since quitting: 27.7   Smokeless tobacco: Never  Vaping Use   Vaping status: Never Used  Substance and Sexual Activity   Alcohol use: Yes    Alcohol/week: 0.0 - 2.0 standard drinks of alcohol   Drug use: No   Sexual activity: Yes    Birth control/protection: Pill  Other Topics Concern   Not on file  Social History Narrative   Not on file   Social Drivers of Health   Financial Resource Strain: Low Risk  (10/12/2023)   Overall Financial Resource Strain (CARDIA)    Difficulty of Paying Living Expenses: Not very hard  Food Insecurity: No Food Insecurity (10/12/2023)   Hunger Vital Sign    Worried About Running Out of Food in the Last  Year: Never true    Ran Out of Food in the Last Year: Never true  Transportation Needs: No Transportation Needs (10/12/2023)   PRAPARE - Administrator, Civil Service (Medical): No    Lack of Transportation (Non-Medical): No  Physical Activity: Sufficiently Active (10/12/2023)   Exercise Vital Sign    Days of Exercise per Week: 5 days    Minutes of Exercise per Session: 30 min  Stress: Stress Concern Present (10/12/2023)   [employer] of Occupational Health - Occupational Stress Questionnaire    Feeling of Stress : To some extent  Social Connections: Moderately Isolated (10/12/2023)   Social Connection and Isolation Panel    Frequency of Communication with Friends and Family: Once a week    Frequency of Social Gatherings with Friends and Family: Twice a week    Attends Religious Services: 1 to 4 times per year    Active Member of [employer] or Organizations: No    Attends Engineer, Structural: Not on file    Marital Status: Separated  Intimate Partner Violence: Not on file    ROS All ROS negative except what is listed in the HPI.   Objective:   Today's Vitals: There were no vitals taken for this visit.  Physical Exam  Assessment & Plan:   Problem List Items Addressed This Visit     Anxiety and depression - Primary      Follow-up: No follow-ups on file.   Waddell FURY Almarie, DNP, FNP-C  I,Emily Lagle,acting as a neurosurgeon for Waddell KATHEE Almarie, NP.,have documented all relevant documentation on the behalf of Waddell KATHEE Almarie, NP.  I, Waddell KATHEE Almarie, NP, have reviewed all documentation for this visit. The documentation on 08/19/2024 for the exam, diagnosis, procedures, and orders are all accurate and complete.

## 2024-08-21 DIAGNOSIS — F331 Major depressive disorder, recurrent, moderate: Secondary | ICD-10-CM | POA: Diagnosis not present

## 2024-08-21 DIAGNOSIS — F4310 Post-traumatic stress disorder, unspecified: Secondary | ICD-10-CM | POA: Diagnosis not present

## 2024-08-30 DIAGNOSIS — F4310 Post-traumatic stress disorder, unspecified: Secondary | ICD-10-CM | POA: Diagnosis not present

## 2024-08-30 DIAGNOSIS — F331 Major depressive disorder, recurrent, moderate: Secondary | ICD-10-CM | POA: Diagnosis not present

## 2024-09-01 ENCOUNTER — Other Ambulatory Visit: Payer: Self-pay | Admitting: Family Medicine

## 2024-09-01 DIAGNOSIS — F419 Anxiety disorder, unspecified: Secondary | ICD-10-CM

## 2024-09-02 ENCOUNTER — Other Ambulatory Visit (HOSPITAL_BASED_OUTPATIENT_CLINIC_OR_DEPARTMENT_OTHER): Payer: Self-pay

## 2024-09-03 ENCOUNTER — Encounter (HOSPITAL_BASED_OUTPATIENT_CLINIC_OR_DEPARTMENT_OTHER): Payer: Self-pay

## 2024-09-03 ENCOUNTER — Other Ambulatory Visit (HOSPITAL_BASED_OUTPATIENT_CLINIC_OR_DEPARTMENT_OTHER): Payer: Self-pay

## 2024-09-04 DIAGNOSIS — F4310 Post-traumatic stress disorder, unspecified: Secondary | ICD-10-CM | POA: Diagnosis not present

## 2024-09-04 DIAGNOSIS — F331 Major depressive disorder, recurrent, moderate: Secondary | ICD-10-CM | POA: Diagnosis not present

## 2024-09-11 ENCOUNTER — Other Ambulatory Visit (HOSPITAL_BASED_OUTPATIENT_CLINIC_OR_DEPARTMENT_OTHER): Payer: Self-pay

## 2024-10-04 ENCOUNTER — Other Ambulatory Visit (HOSPITAL_COMMUNITY): Payer: Self-pay

## 2024-10-08 ENCOUNTER — Other Ambulatory Visit (HOSPITAL_BASED_OUTPATIENT_CLINIC_OR_DEPARTMENT_OTHER): Payer: Self-pay

## 2024-10-08 MED ORDER — FLUCONAZOLE 150 MG PO TABS
150.0000 mg | ORAL_TABLET | ORAL | 1 refills | Status: AC
  Filled 2024-10-08: qty 2, 4d supply, fill #0
  Filled 2024-10-23: qty 2, 4d supply, fill #1

## 2024-10-23 ENCOUNTER — Other Ambulatory Visit: Payer: Self-pay | Admitting: Family Medicine

## 2024-10-23 DIAGNOSIS — F419 Anxiety disorder, unspecified: Secondary | ICD-10-CM

## 2024-10-24 ENCOUNTER — Other Ambulatory Visit: Payer: Self-pay

## 2024-10-24 NOTE — Telephone Encounter (Signed)
 "  Got pt scheduled  "

## 2024-10-24 NOTE — Telephone Encounter (Signed)
 Patient has no showed last two appts. Needs appt scheduled to refill medications. Please schedule.

## 2024-11-07 ENCOUNTER — Ambulatory Visit: Admitting: Family Medicine
